# Patient Record
Sex: Male | Born: 1955 | Race: White | Hispanic: No | Marital: Single | Smoking: Current some day smoker
Health system: Southern US, Community
[De-identification: ages and names within clinical notes are randomized; demographics above are authoritative.]

## PROBLEM LIST (undated history)

## (undated) DIAGNOSIS — M109 Gout, unspecified: Secondary | ICD-10-CM

## (undated) DIAGNOSIS — M1990 Unspecified osteoarthritis, unspecified site: Secondary | ICD-10-CM

## (undated) DIAGNOSIS — K219 Gastro-esophageal reflux disease without esophagitis: Secondary | ICD-10-CM

## (undated) HISTORY — PX: CYST EXCISION: SHX5701

## (undated) HISTORY — PX: OTHER SURGICAL HISTORY: SHX169

---

## 2016-10-31 ENCOUNTER — Ambulatory Visit: Payer: Self-pay | Admitting: Surgery

## 2016-10-31 NOTE — H&P (Signed)
Peter Wright 10/31/2016 11:12 AM Location: [facility] Patient #: [id number] DOB: [birth year] Single / Language: [place] / Race: Refused to Report/Unreported Male  History of Present Illness (Ravinder Hofland A. Fredricka Bonine MD; 10/31/2016 11:34 AM) Patient words: This is a very nice 61 year old gentleman who presents with bilateral inguinal hernias. He first noticed them about a year ago. They're reducible, and he does not believe they've increased in size, however they have started to cause him some discomfort and he is interested in repair at this time. He has been avoiding any heavy lifting and believes he will to continue to do so after surgery for as long as necessary. He is not having any changes in bowel function or urinary function. No issues with nausea, emesis or bloating. He does not regularly go to a doctor, but does not have any medical problems that he knows of. He denies any issues with chest pain or pressure or shortness of breath with exertion. No prior abdominal surgeries. He does smoke, less than 1 pack a day and has cut down significantly over the last year.  The patient is a 61 year old male.   Past Surgical History Hennie Duos, CMA; 10/31/2016 11:13 AM) No pertinent past surgical history  Diagnostic Studies History Hennie Duos, CMA; 10/31/2016 11:13 AM) Colonoscopy never  Allergies (Yasmin Roberson, CMA; 10/31/2016 11:12 AM) No Known Drug Allergies 10/31/2016 Allergies Reconciled  Medication History (Yasmin Roberson, CMA; 10/31/2016 11:12 AM) No Current Medications Medications Reconciled  Social History Hennie Duos, CMA; 10/31/2016 11:13 AM) Alcohol use Occasional alcohol use. Caffeine use Carbonated beverages, Coffee, Tea. No drug use Tobacco use Current some day smoker.  Family History Hennie Duos, CMA; 10/31/2016 11:13 AM) Breast Cancer Mother.  Other Problems (Yasmin Roberson, CMA; 10/31/2016 11:13 AM) Inguinal  Hernia     Review of Systems (Yasmin Roberson CMA; 10/31/2016 11:13 AM) General Not Present- Appetite Loss, Chills, Fatigue, Fever, Night Sweats, Weight Gain and Weight Loss. Skin Not Present- Change in Wart/Mole, Dryness, Hives, Jaundice, New Lesions, Non-Healing Wounds, Rash and Ulcer. HEENT Present- Seasonal Allergies. Not Present- Earache, Hearing Loss, Hoarseness, Nose Bleed, Oral Ulcers, Ringing in the Ears, Sinus Pain, Sore Throat, Visual Disturbances, Wears glasses/contact lenses and Yellow Eyes. Respiratory Not Present- Bloody sputum, Chronic Cough, Difficulty Breathing, Snoring and Wheezing. Breast Not Present- Breast Mass, Breast Pain, Nipple Discharge and Skin Changes. Cardiovascular Not Present- Chest Pain, Difficulty Breathing Lying Down, Leg Cramps, Palpitations, Rapid Heart Rate, Shortness of Breath and Swelling of Extremities. Gastrointestinal Present- Abdominal Pain. Not Present- Bloating, Bloody Stool, Change in Bowel Habits, Chronic diarrhea, Constipation, Difficulty Swallowing, Excessive gas, Gets full quickly at meals, Hemorrhoids, Indigestion, Nausea, Rectal Pain and Vomiting. Male Genitourinary Not Present- Blood in Urine, Change in Urinary Stream, Frequency, Impotence, Nocturia, Painful Urination, Urgency and Urine Leakage. Musculoskeletal Not Present- Back Pain, Joint Pain, Joint Stiffness, Muscle Pain, Muscle Weakness and Swelling of Extremities. Neurological Not Present- Decreased Memory, Fainting, Headaches, Numbness, Seizures, Tingling, Tremor, Trouble walking and Weakness. Psychiatric Not Present- Anxiety, Bipolar, Change in Sleep Pattern, Depression, Fearful and Frequent crying. Endocrine Not Present- Cold Intolerance, Excessive Hunger, Hair Changes, Heat Intolerance, Hot flashes and New Diabetes. Hematology Not Present- Blood Thinners, Easy Bruising, Excessive bleeding, Gland problems, HIV and Persistent Infections.  Vitals (Yasmin Roberson CMA; 10/31/2016 11:12  AM) 10/31/2016 11:12 AM Weight: 162.6 lb Height: 69in Body Surface Area: 1.89 m Body Mass Index: 24.01 kg/m  Temp.: 98.14F  Pulse: 66 (Regular)  BP: 130/88 (Sitting, Left Arm, Standard)  Physical Exam (Elder Davidian A. Fredricka Bonine MD; 10/31/2016 11:36 AM)  General Note: Alert, appears older than stated age  Integumentary Note: No lesions or rashes on limited skin exam  Eye Note: Anicteric, extraocular motion intact  ENMT Note: Moist mucous membranes, normal phonation  Chest and Lung Exam Note: Unlabored respirations, symmetrical air entry  Cardiovascular Note: Regular rate and rhythm, no pedal edema  Abdomen Note: Soft, nontender nondistended. No mass or organomegaly. No umbilical hernia. There are bilateral inguinal hernias which are reducible.  Neurologic Note: Grossly normal, normal gait  Neuropsychiatric Note: Normal mood and affect, appropriate insight  Musculoskeletal Note: Strength symmetrical throughout, no deformity    Assessment & Plan (Dorrie Cocuzza A. Fredricka Bonine MD; 10/31/2016 11:38 AM)  BILATERAL INGUINAL HERNIA (K40.20) Story: Bilateral inguinal hernias, reducible. Causing him pain. I recommended a laparoscopic bilateral inguinal hernia repair. I discussed with him the nature of this operation and the use of mesh. I counseled him as to the risks of bleeding, infection, pain, scarring, injury to intra-abdominal or pelvic organs including bowel, nerves, blood vessels and bladder, although very low risk of any of these. We discussed the risk of chronic pain. I counseled him as to the risk of hernia recurrence, advising him that his risk is increased due to his tobacco abuse. He expressed understanding of our conversation and desires to proceed. All his questions were answered.

## 2016-11-12 ENCOUNTER — Ambulatory Visit: Payer: Self-pay | Admitting: General Surgery

## 2016-11-14 NOTE — Pre-Procedure Instructions (Signed)
    Peter Prows Kernan Jr.  11/14/2016      [id number] - [place], [place] - [address] [place] [address] Phone: [phone] Fax: [phone]    Your procedure is scheduled on Monday, April 30th   Report to [facility] Admitting at 1:00 pm             Call this number if you have problems the morning of surgery:  [phone], other questions you may call [phone] from Mon - Fri from 8-4:30pm   Remember:  Do not eat food or drink liquids after midnight Sunday.   Take these medicines the morning of surgery with A SIP OF WATER : NONE                      4-5 days prior to surgery STOP taking any herbal supplements, vitamins, and anti-inflammatories.   Do not wear jewelry - no rings or watches.  Do not wear lotions, colognes or deoderant.             Men may shave face and neck.   Do not bring valuables to the hospital.  [facility] is not responsible for any belongings or valuables.  Contacts, dentures or bridgework may not be worn into surgery.  Leave your suitcase in the car.  After surgery it may be brought to your room. For patients admitted to the hospital, discharge time will be determined by your treatment team.  Patients discharged the day of surgery will not be allowed to drive home, AND you will need to have someone stay with you for 24 hrs.   Please read over the following fact sheets that you were given. Pain Booklet and Surgical Site Infection Prevention

## 2016-11-15 ENCOUNTER — Encounter (HOSPITAL_COMMUNITY): Payer: Self-pay

## 2016-11-15 ENCOUNTER — Encounter (HOSPITAL_COMMUNITY)
Admission: RE | Admit: 2016-11-15 | Discharge: 2016-11-15 | Disposition: A | Discharge status: Home / Self Care | Payer: Worker's Compensation | Source: Ambulatory Visit | Attending: Surgery | Admitting: Surgery

## 2016-11-15 ENCOUNTER — Ambulatory Visit (HOSPITAL_COMMUNITY)
Admission: RE | Admit: 2016-11-15 | Discharge: 2016-11-15 | Disposition: A | Discharge status: Home / Self Care | Payer: Self-pay | Source: Ambulatory Visit | Attending: Surgery | Admitting: Surgery

## 2016-11-15 DIAGNOSIS — K4020 Bilateral inguinal hernia, without obstruction or gangrene, not specified as recurrent: Secondary | ICD-10-CM | POA: Diagnosis not present

## 2016-11-15 DIAGNOSIS — Z01810 Encounter for preprocedural cardiovascular examination: Secondary | ICD-10-CM | POA: Insufficient documentation

## 2016-11-15 DIAGNOSIS — K219 Gastro-esophageal reflux disease without esophagitis: Secondary | ICD-10-CM | POA: Diagnosis not present

## 2016-11-15 DIAGNOSIS — F17200 Nicotine dependence, unspecified, uncomplicated: Secondary | ICD-10-CM | POA: Diagnosis not present

## 2016-11-15 DIAGNOSIS — M1990 Unspecified osteoarthritis, unspecified site: Secondary | ICD-10-CM | POA: Diagnosis not present

## 2016-11-15 DIAGNOSIS — Z803 Family history of malignant neoplasm of breast: Secondary | ICD-10-CM | POA: Diagnosis not present

## 2016-11-15 DIAGNOSIS — J449 Chronic obstructive pulmonary disease, unspecified: Secondary | ICD-10-CM | POA: Diagnosis not present

## 2016-11-15 HISTORY — DX: Gout, unspecified: M10.9

## 2016-11-15 HISTORY — DX: Unspecified osteoarthritis, unspecified site: M19.90

## 2016-11-15 HISTORY — DX: Gastro-esophageal reflux disease without esophagitis: K21.9

## 2016-11-15 LAB — CBC WITH DIFFERENTIAL/PLATELET
Basophils Absolute: 0 10*3/uL (ref 0.0–0.1)
Basophils Relative: 0 %
Eosinophils Absolute: 0.2 10*3/uL (ref 0.0–0.7)
Eosinophils Relative: 2 %
HEMATOCRIT: 43.1 % (ref 39.0–52.0)
HEMOGLOBIN: 14.3 g/dL (ref 13.0–17.0)
LYMPHS ABS: 3.2 10*3/uL (ref 0.7–4.0)
LYMPHS PCT: 30 %
MCH: 27.6 pg (ref 26.0–34.0)
MCHC: 33.2 g/dL (ref 30.0–36.0)
MCV: 83 fL (ref 78.0–100.0)
MONOS PCT: 5 %
Monocytes Absolute: 0.5 10*3/uL (ref 0.1–1.0)
NEUTROS ABS: 6.6 10*3/uL (ref 1.7–7.7)
NEUTROS PCT: 63 %
Platelets: 303 10*3/uL (ref 150–400)
RBC: 5.19 MIL/uL (ref 4.22–5.81)
RDW: 14.7 % (ref 11.5–15.5)
WBC: 10.5 10*3/uL (ref 4.0–10.5)

## 2016-11-15 LAB — BASIC METABOLIC PANEL
ANION GAP: 9 (ref 5–15)
BUN: 8 mg/dL (ref 6–20)
CHLORIDE: 103 mmol/L (ref 101–111)
CO2: 24 mmol/L (ref 22–32)
Calcium: 9.2 mg/dL (ref 8.9–10.3)
Creatinine, Ser: 1.1 mg/dL (ref 0.61–1.24)
GFR calc non Af Amer: >60 mL/min (ref >60)
Glucose, Bld: 93 mg/dL (ref 65–99)
POTASSIUM: 4.2 mmol/L (ref 3.5–5.1)
Sodium: 136 mmol/L (ref 135–145)

## 2016-11-15 LAB — APTT: aPTT: 34 seconds (ref 24–36)

## 2016-11-15 LAB — PROTIME-INR
INR: 1.01
Prothrombin Time: 13.4 seconds (ref 11.4–15.2)

## 2016-11-15 NOTE — Progress Notes (Signed)
Has no PCP.  Goes to Urgent Care off of [address].   Has h/o gout, multiple teeth pull, ? Allergy to bees. Denies any cardiac issues, no cp, or sob

## 2016-11-18 ENCOUNTER — Ambulatory Visit (HOSPITAL_COMMUNITY): Payer: Worker's Compensation | Admitting: Anesthesiology

## 2016-11-18 ENCOUNTER — Ambulatory Visit (HOSPITAL_COMMUNITY)
Admission: RE | Admit: 2016-11-18 | Discharge: 2016-11-18 | Disposition: A | Discharge status: Home / Self Care | Payer: Worker's Compensation | Source: Ambulatory Visit | Attending: Surgery | Admitting: Surgery

## 2016-11-18 ENCOUNTER — Encounter (HOSPITAL_COMMUNITY)
Admission: RE | Disposition: A | Discharge status: Home / Self Care | Payer: Self-pay | Source: Ambulatory Visit | Attending: Surgery

## 2016-11-18 ENCOUNTER — Encounter (HOSPITAL_COMMUNITY): Payer: Self-pay | Admitting: *Deleted

## 2016-11-18 DIAGNOSIS — Z803 Family history of malignant neoplasm of breast: Secondary | ICD-10-CM | POA: Insufficient documentation

## 2016-11-18 DIAGNOSIS — F17200 Nicotine dependence, unspecified, uncomplicated: Secondary | ICD-10-CM | POA: Diagnosis not present

## 2016-11-18 DIAGNOSIS — K219 Gastro-esophageal reflux disease without esophagitis: Secondary | ICD-10-CM | POA: Insufficient documentation

## 2016-11-18 DIAGNOSIS — M1990 Unspecified osteoarthritis, unspecified site: Secondary | ICD-10-CM | POA: Insufficient documentation

## 2016-11-18 DIAGNOSIS — J449 Chronic obstructive pulmonary disease, unspecified: Secondary | ICD-10-CM | POA: Insufficient documentation

## 2016-11-18 DIAGNOSIS — K4020 Bilateral inguinal hernia, without obstruction or gangrene, not specified as recurrent: Secondary | ICD-10-CM | POA: Diagnosis not present

## 2016-11-18 HISTORY — PX: INGUINAL HERNIA REPAIR: SHX194

## 2016-11-18 SURGERY — REPAIR, HERNIA, INGUINAL, BILATERAL, LAPAROSCOPIC
Anesthesia: General | Site: Abdomen | Laterality: Bilateral

## 2016-11-18 MED ORDER — FENTANYL CITRATE (PF) 100 MCG/2ML IJ SOLN
INTRAMUSCULAR | Status: AC
  Filled 2016-11-18: qty 2

## 2016-11-18 MED ORDER — ROCURONIUM BROMIDE 50 MG/5ML IV SOSY
PREFILLED_SYRINGE | INTRAVENOUS | Status: DC | PRN
  Administered 2016-11-18: 40 mg via INTRAVENOUS

## 2016-11-18 MED ORDER — ONDANSETRON HCL 4 MG/2ML IJ SOLN
INTRAMUSCULAR | Status: AC
  Filled 2016-11-18: qty 2

## 2016-11-18 MED ORDER — ACETAMINOPHEN 500 MG PO TABS
1000.0000 mg | ORAL_TABLET | ORAL | Status: AC
  Administered 2016-11-18: 1000 mg via ORAL
  Filled 2016-11-18: qty 2

## 2016-11-18 MED ORDER — HYDROMORPHONE HCL 1 MG/ML IJ SOLN: 0.2500 mg | INTRAMUSCULAR | Status: DC | PRN

## 2016-11-18 MED ORDER — PROPOFOL 10 MG/ML IV BOLUS
INTRAVENOUS | Status: DC | PRN
  Administered 2016-11-18: 150 mg via INTRAVENOUS

## 2016-11-18 MED ORDER — SUCCINYLCHOLINE CHLORIDE 200 MG/10ML IV SOSY
PREFILLED_SYRINGE | INTRAVENOUS | Status: AC
  Filled 2016-11-18: qty 10

## 2016-11-18 MED ORDER — ROCURONIUM BROMIDE 50 MG/5ML IV SOSY
PREFILLED_SYRINGE | INTRAVENOUS | Status: AC
  Filled 2016-11-18: qty 5

## 2016-11-18 MED ORDER — ONDANSETRON HCL 4 MG/2ML IJ SOLN
INTRAMUSCULAR | Status: DC | PRN
  Administered 2016-11-18: 4 mg via INTRAVENOUS

## 2016-11-18 MED ORDER — CEFAZOLIN SODIUM-DEXTROSE 2-4 GM/100ML-% IV SOLN
2.0000 g | INTRAVENOUS | Status: AC
  Administered 2016-11-18: 2 g via INTRAVENOUS
  Filled 2016-11-18: qty 100

## 2016-11-18 MED ORDER — MIDAZOLAM HCL 2 MG/2ML IJ SOLN: 0.5000 mg | Freq: Once | INTRAMUSCULAR | Status: DC | PRN

## 2016-11-18 MED ORDER — BUPIVACAINE HCL (PF) 0.25 % IJ SOLN
INTRAMUSCULAR | Status: AC
  Filled 2016-11-18: qty 30

## 2016-11-18 MED ORDER — PROPOFOL 10 MG/ML IV BOLUS
INTRAVENOUS | Status: AC
  Filled 2016-11-18: qty 20

## 2016-11-18 MED ORDER — CHLORHEXIDINE GLUCONATE 4 % EX LIQD: 60.0000 mL | Freq: Once | CUTANEOUS | Status: DC

## 2016-11-18 MED ORDER — SUGAMMADEX SODIUM 200 MG/2ML IV SOLN
INTRAVENOUS | Status: DC | PRN
  Administered 2016-11-18: 200 mg via INTRAVENOUS

## 2016-11-18 MED ORDER — DOCUSATE SODIUM 100 MG PO CAPS: 100.0000 mg | ORAL_CAPSULE | Freq: Two times a day (BID) | ORAL | 0 refills | Status: AC

## 2016-11-18 MED ORDER — SUGAMMADEX SODIUM 200 MG/2ML IV SOLN
INTRAVENOUS | Status: AC
  Filled 2016-11-18: qty 2

## 2016-11-18 MED ORDER — BUPIVACAINE HCL (PF) 0.25 % IJ SOLN
INTRAMUSCULAR | Status: DC | PRN
  Administered 2016-11-18: 30 mL

## 2016-11-18 MED ORDER — OXYCODONE HCL 5 MG PO TABS: 5.0000 mg | ORAL_TABLET | ORAL | 0 refills | Status: DC | PRN

## 2016-11-18 MED ORDER — EPHEDRINE 5 MG/ML INJ
INTRAVENOUS | Status: AC
  Filled 2016-11-18: qty 10

## 2016-11-18 MED ORDER — EPHEDRINE SULFATE 50 MG/ML IJ SOLN
INTRAMUSCULAR | Status: DC | PRN
  Administered 2016-11-18: 10 mg via INTRAVENOUS

## 2016-11-18 MED ORDER — LABETALOL HCL 5 MG/ML IV SOLN
INTRAVENOUS | Status: AC
  Filled 2016-11-18: qty 4

## 2016-11-18 MED ORDER — MEPERIDINE HCL 50 MG/ML IJ SOLN: 6.2500 mg | INTRAMUSCULAR | Status: DC | PRN

## 2016-11-18 MED ORDER — 0.9 % SODIUM CHLORIDE (POUR BTL) OPTIME
TOPICAL | Status: DC | PRN
  Administered 2016-11-18: 1000 mL

## 2016-11-18 MED ORDER — CELECOXIB 200 MG PO CAPS
400.0000 mg | ORAL_CAPSULE | ORAL | Status: AC
  Administered 2016-11-18: 400 mg via ORAL
  Filled 2016-11-18: qty 2

## 2016-11-18 MED ORDER — LIDOCAINE 2% (20 MG/ML) 5 ML SYRINGE
INTRAMUSCULAR | Status: DC | PRN
  Administered 2016-11-18: 80 mg via INTRAVENOUS

## 2016-11-18 MED ORDER — GABAPENTIN 300 MG PO CAPS
300.0000 mg | ORAL_CAPSULE | ORAL | Status: AC
  Administered 2016-11-18: 300 mg via ORAL
  Filled 2016-11-18: qty 1

## 2016-11-18 MED ORDER — BUPIVACAINE LIPOSOME 1.3 % IJ SUSP
20.0000 mL | Freq: Once | INTRAMUSCULAR | Status: AC
  Administered 2016-11-18: 20 mL
  Filled 2016-11-18: qty 20

## 2016-11-18 MED ORDER — DEXAMETHASONE SODIUM PHOSPHATE 10 MG/ML IJ SOLN
INTRAMUSCULAR | Status: AC
  Filled 2016-11-18: qty 1

## 2016-11-18 MED ORDER — LIDOCAINE 2% (20 MG/ML) 5 ML SYRINGE
INTRAMUSCULAR | Status: AC
  Filled 2016-11-18: qty 5

## 2016-11-18 MED ORDER — FENTANYL CITRATE (PF) 100 MCG/2ML IJ SOLN
INTRAMUSCULAR | Status: DC | PRN
  Administered 2016-11-18 (×4): 50 ug via INTRAVENOUS

## 2016-11-18 MED ORDER — LACTATED RINGERS IV SOLN
INTRAVENOUS | Status: DC
  Administered 2016-11-18: 12:00:00 via INTRAVENOUS

## 2016-11-18 MED ORDER — SUCCINYLCHOLINE CHLORIDE 200 MG/10ML IV SOSY
PREFILLED_SYRINGE | INTRAVENOUS | Status: DC | PRN
  Administered 2016-11-18: 100 mg via INTRAVENOUS

## 2016-11-18 MED ORDER — PROMETHAZINE HCL 25 MG/ML IJ SOLN: 6.2500 mg | INTRAMUSCULAR | Status: DC | PRN

## 2016-11-18 MED ORDER — DEXAMETHASONE SODIUM PHOSPHATE 10 MG/ML IJ SOLN
INTRAMUSCULAR | Status: DC | PRN
  Administered 2016-11-18: 10 mg via INTRAVENOUS

## 2016-11-18 SURGICAL SUPPLY — 27 items
ADH SKN CLS APL DERMABOND .7 (GAUZE/BANDAGES/DRESSINGS) ×1
CABLE HIGH FREQUENCY MONO STRZ (ELECTRODE) ×3 IMPLANT
CHLORAPREP W/TINT 26ML (MISCELLANEOUS) ×3 IMPLANT
COVER SURGICAL LIGHT HANDLE (MISCELLANEOUS) ×3 IMPLANT
DERMABOND ADVANCED (GAUZE/BANDAGES/DRESSINGS) ×2
DERMABOND ADVANCED .7 DNX12 (GAUZE/BANDAGES/DRESSINGS) ×1 IMPLANT
DEVICE PMI PUNCTURE CLOSURE (MISCELLANEOUS) ×3 IMPLANT
DEVICE SECURE STRAP 25 ABSORB (INSTRUMENTS) ×3 IMPLANT
ELECT REM PT RETURN 15FT ADLT (MISCELLANEOUS) ×3 IMPLANT
GLOVE BIO SURGEON STRL SZ 6 (GLOVE) ×3 IMPLANT
GLOVE INDICATOR 6.5 STRL GRN (GLOVE) ×3 IMPLANT
GOWN STRL REUS W/TWL LRG LVL3 (GOWN DISPOSABLE) ×3 IMPLANT
GOWN STRL REUS W/TWL XL LVL3 (GOWN DISPOSABLE) ×6 IMPLANT
IRRIG SUCT STRYKERFLOW 2 WTIP (MISCELLANEOUS)
IRRIGATION SUCT STRKRFLW 2 WTP (MISCELLANEOUS) IMPLANT
KIT BASIN OR (CUSTOM PROCEDURE TRAY) ×3 IMPLANT
MESH ULTRAPRO 3X6 7.6X15CM (Mesh General) ×6 IMPLANT
NEEDLE INSUFFLATION 14GA 120MM (NEEDLE) ×3 IMPLANT
SCISSORS LAP 5X35 DISP (ENDOMECHANICALS) ×3 IMPLANT
SLEEVE XCEL OPT CAN 5 100 (ENDOMECHANICALS) ×3 IMPLANT
SUT MNCRL AB 4-0 PS2 18 (SUTURE) ×3 IMPLANT
TOWEL OR 17X26 10 PK STRL BLUE (TOWEL DISPOSABLE) ×3 IMPLANT
TOWEL OR NON WOVEN STRL DISP B (DISPOSABLE) IMPLANT
TRAY LAPAROSCOPIC (CUSTOM PROCEDURE TRAY) ×3 IMPLANT
TROCAR BLADELESS OPT 12M 100M (ENDOMECHANICALS) ×3 IMPLANT
TROCAR BLADELESS OPT 5 100 (ENDOMECHANICALS) ×3 IMPLANT
TUBING INSUF HEATED (TUBING) ×3 IMPLANT

## 2016-11-18 NOTE — Anesthesia Postprocedure Evaluation (Signed)
Anesthesia Post Note  Patient: Peter Wright.  Procedure(s) Performed: Procedure(s) (LRB): LAPAROSCOPIC BILATERAL INGUINAL HERNIA REPAIR (TAP) (Bilateral)  Patient location during evaluation: PACU Anesthesia Type: General Level of consciousness: awake and alert, oriented and patient cooperative Pain management: pain level controlled Vital Signs Assessment: post-procedure vital signs reviewed and stable Respiratory status: spontaneous breathing, nonlabored ventilation and respiratory function stable Cardiovascular status: blood pressure returned to baseline and stable Postop Assessment: no signs of nausea or vomiting Anesthetic complications: no       Last Vitals:  Vitals:   11/18/16 1445 11/18/16 1456  BP: (!) 155/97 (!) 154/95  Pulse: 68 63  Resp: 18 14  Temp: 36.2 C 36.2 C    Last Pain:  Vitals:   11/18/16 1405  TempSrc:   PainSc: Asleep                 Maritza Hosterman,E. Axie Hayne

## 2016-11-18 NOTE — Interval H&P Note (Signed)
History and Physical Interval Note:  11/18/2016 11:39 AM  Peter Wright [name].  has presented today for surgery, with the diagnosis of Bilateral inguinal hernias  The various methods of treatment have been discussed with the patient and family. After consideration of risks, benefits and other options for treatment, the patient has consented to  Procedure(s): LAPAROSCOPIC BILATERAL INGUINAL HERNIA REPAIR (TAP) (Bilateral) as a surgical intervention .  The patient's history has been reviewed, patient examined, no change in status, stable for surgery.  I have reviewed the patient's chart and labs.  Questions were answered to the patient's satisfaction.     [name] Lollie Sails

## 2016-11-18 NOTE — Transfer of Care (Signed)
Immediate Anesthesia Transfer of Care Note  Patient: Peter Wright.  Procedure(s) Performed: Procedure(s): LAPAROSCOPIC BILATERAL INGUINAL HERNIA REPAIR (TAP) (Bilateral)  Patient Location: PACU  Anesthesia Type:General  Level of Consciousness: awake and patient cooperative  Airway & Oxygen Therapy: Patient Spontanous Breathing and Patient connected to face mask oxygen  Post-op Assessment: Report given to RN and Post -op Vital signs reviewed and stable  Post vital signs: Reviewed and stable  Last Vitals:  Vitals:   11/18/16 1122  BP: (!) 161/93  Pulse: 64  Resp: 18  Temp: 36.8 C    Last Pain:  Vitals:   11/18/16 1149  TempSrc:   PainSc: 2       Patients Stated Pain Goal: 6 (11/18/16 1149)  Complications: No apparent anesthesia complications

## 2016-11-18 NOTE — H&P (View-Only) (Signed)
Peter Wright 10/31/2016 11:12 AM Location: [facility] Patient #: [id number] DOB: [birth year] Single / Language: [place] / Race: Refused to Report/Unreported Male  History of Present Illness (Kierstan Auer A. Fredricka Bonine MD; 10/31/2016 11:34 AM) Patient words: This is a very nice 61 year old gentleman who presents with bilateral inguinal hernias. He first noticed them about a year ago. They're reducible, and he does not believe they've increased in size, however they have started to cause him some discomfort and he is interested in repair at this time. He has been avoiding any heavy lifting and believes he will to continue to do so after surgery for as long as necessary. He is not having any changes in bowel function or urinary function. No issues with nausea, emesis or bloating. He does not regularly go to a doctor, but does not have any medical problems that he knows of. He denies any issues with chest pain or pressure or shortness of breath with exertion. No prior abdominal surgeries. He does smoke, less than 1 pack a day and has cut down significantly over the last year.  The patient is a 61 year old male.   Past Surgical History Hennie Duos, CMA; 10/31/2016 11:13 AM) No pertinent past surgical history  Diagnostic Studies History Hennie Duos, CMA; 10/31/2016 11:13 AM) Colonoscopy never  Allergies (Yasmin Roberson, CMA; 10/31/2016 11:12 AM) No Known Drug Allergies 10/31/2016 Allergies Reconciled  Medication History (Yasmin Roberson, CMA; 10/31/2016 11:12 AM) No Current Medications Medications Reconciled  Social History Hennie Duos, CMA; 10/31/2016 11:13 AM) Alcohol use Occasional alcohol use. Caffeine use Carbonated beverages, Coffee, Tea. No drug use Tobacco use Current some day smoker.  Family History Hennie Duos, CMA; 10/31/2016 11:13 AM) Breast Cancer Mother.  Other Problems (Yasmin Roberson, CMA; 10/31/2016 11:13 AM) Inguinal  Hernia     Review of Systems (Yasmin Roberson CMA; 10/31/2016 11:13 AM) General Not Present- Appetite Loss, Chills, Fatigue, Fever, Night Sweats, Weight Gain and Weight Loss. Skin Not Present- Change in Wart/Mole, Dryness, Hives, Jaundice, New Lesions, Non-Healing Wounds, Rash and Ulcer. HEENT Present- Seasonal Allergies. Not Present- Earache, Hearing Loss, Hoarseness, Nose Bleed, Oral Ulcers, Ringing in the Ears, Sinus Pain, Sore Throat, Visual Disturbances, Wears glasses/contact lenses and Yellow Eyes. Respiratory Not Present- Bloody sputum, Chronic Cough, Difficulty Breathing, Snoring and Wheezing. Breast Not Present- Breast Mass, Breast Pain, Nipple Discharge and Skin Changes. Cardiovascular Not Present- Chest Pain, Difficulty Breathing Lying Down, Leg Cramps, Palpitations, Rapid Heart Rate, Shortness of Breath and Swelling of Extremities. Gastrointestinal Present- Abdominal Pain. Not Present- Bloating, Bloody Stool, Change in Bowel Habits, Chronic diarrhea, Constipation, Difficulty Swallowing, Excessive gas, Gets full quickly at meals, Hemorrhoids, Indigestion, Nausea, Rectal Pain and Vomiting. Male Genitourinary Not Present- Blood in Urine, Change in Urinary Stream, Frequency, Impotence, Nocturia, Painful Urination, Urgency and Urine Leakage. Musculoskeletal Not Present- Back Pain, Joint Pain, Joint Stiffness, Muscle Pain, Muscle Weakness and Swelling of Extremities. Neurological Not Present- Decreased Memory, Fainting, Headaches, Numbness, Seizures, Tingling, Tremor, Trouble walking and Weakness. Psychiatric Not Present- Anxiety, Bipolar, Change in Sleep Pattern, Depression, Fearful and Frequent crying. Endocrine Not Present- Cold Intolerance, Excessive Hunger, Hair Changes, Heat Intolerance, Hot flashes and New Diabetes. Hematology Not Present- Blood Thinners, Easy Bruising, Excessive bleeding, Gland problems, HIV and Persistent Infections.  Vitals (Yasmin Roberson CMA; 10/31/2016 11:12  AM) 10/31/2016 11:12 AM Weight: 162.6 lb Height: 69in Body Surface Area: 1.89 m Body Mass Index: 24.01 kg/m  Temp.: 98.37F  Pulse: 66 (Regular)  BP: 130/88 (Sitting, Left Arm, Standard)  Physical Exam (Ellon Marasco A. Fredricka Bonine MD; 10/31/2016 11:36 AM)  General Note: Alert, appears older than stated age  Integumentary Note: No lesions or rashes on limited skin exam  Eye Note: Anicteric, extraocular motion intact  ENMT Note: Moist mucous membranes, normal phonation  Chest and Lung Exam Note: Unlabored respirations, symmetrical air entry  Cardiovascular Note: Regular rate and rhythm, no pedal edema  Abdomen Note: Soft, nontender nondistended. No mass or organomegaly. No umbilical hernia. There are bilateral inguinal hernias which are reducible.  Neurologic Note: Grossly normal, normal gait  Neuropsychiatric Note: Normal mood and affect, appropriate insight  Musculoskeletal Note: Strength symmetrical throughout, no deformity    Assessment & Plan (Maya Arcand A. Fredricka Bonine MD; 10/31/2016 11:38 AM)  BILATERAL INGUINAL HERNIA (K40.20) Story: Bilateral inguinal hernias, reducible. Causing him pain. I recommended a laparoscopic bilateral inguinal hernia repair. I discussed with him the nature of this operation and the use of mesh. I counseled him as to the risks of bleeding, infection, pain, scarring, injury to intra-abdominal or pelvic organs including bowel, nerves, blood vessels and bladder, although very low risk of any of these. We discussed the risk of chronic pain. I counseled him as to the risk of hernia recurrence, advising him that his risk is increased due to his tobacco abuse. He expressed understanding of our conversation and desires to proceed. All his questions were answered.

## 2016-11-18 NOTE — Discharge Instructions (Signed)
HERNIA REPAIR: POST OP INSTRUCTIONS  ######################################################################  EAT Gradually transition to a high fiber diet with a fiber supplement over the next few weeks after discharge.  Start with a pureed / full liquid diet (see below)  WALK Walk an hour a day.  Control your pain to do that.    CONTROL PAIN Control pain so that you can walk, sleep, tolerate sneezing/coughing, go up/down stairs.  HAVE A BOWEL MOVEMENT DAILY Keep your bowels regular to avoid problems.  OK to try a laxative to override constipation.  OK to use an antidairrheal to slow down diarrhea.  Call if not better after 2 tries  CALL IF YOU HAVE PROBLEMS/CONCERNS Call if you are still struggling despite following these instructions. Call if you have concerns not answered by these instructions  ######################################################################    1. DIET: Follow a light bland diet the first 24 hours after arrival home, such as soup, liquids, crackers, etc.  Be sure to include lots of fluids daily.  Avoid fast food or heavy meals as your are more likely to get nauseated.  Eat a low fat the next few days after surgery. 2. Take your usually prescribed home medications unless otherwise directed. 3. PAIN CONTROL: a. Pain is best controlled by a usual combination of three different methods TOGETHER: i. Ice/Heat ii. Over the counter pain medication iii. Prescription pain medication b. Most patients will experience some swelling and bruising around the hernia(s) such as the bellybutton, groins, or old incisions.  Ice packs or heating pads (30-60 minutes up to 6 times a day) will help. Use ice for the first few days to help decrease swelling and bruising, then switch to heat to help relax tight/sore spots and speed recovery.  Some people prefer to use ice alone, heat alone, alternating between ice & heat.  Experiment to what works for you.  Swelling and bruising can take  several weeks to resolve.   c. It is helpful to take an over-the-counter pain medication regularly for the first few weeks.  Choose one of the following that works best for you: i. Naproxen (Aleve, etc)  Two  tabs twice a day ii. Ibuprofen (Advil, etc) Three  tabs four times a day (every meal & bedtime) iii. Acetaminophen (Tylenol, etc) 325-650mg  four times a day (every meal & bedtime) d. A  prescription for pain medication should be given to you upon discharge.  Take your pain medication as prescribed.  i. If you are having problems/concerns with the prescription medicine (does not control pain, nausea, vomiting, rash, itching, etc), please call us [phone] to see if we need to switch you to a different pain medicine that will work better for you and/or control your side effect better. ii. If you need a refill on your pain medication, please contact your pharmacy.  They will contact our office to request authorization. Prescriptions will not be filled after 5 pm or on week-ends. 4. Avoid getting constipated.  Between the surgery and the pain medications, it is common to experience some constipation.  Increasing fluid intake and taking a fiber supplement (such as Metamucil, Citrucel, FiberCon, MiraLax, etc) 1-2 times a day regularly will usually help prevent this problem from occurring.  A mild laxative (prune juice, Milk of Magnesia, MiraLax, etc) should be taken according to package directions if there are no bowel movements after 48 hours.   5. Wash / shower every day.  You may shower over skin glue which is water proof.  No soaking or  swimming for at least 2 weeks.  6. The skin glue will flake off after a couple weeks. No need to use any kind of dressing.    7. ACTIVITIES as tolerated:   a. You may resume regular (light) daily activities beginning the next day--such as daily self-care, walking, climbing stairs--gradually increasing activities as tolerated.  If you can walk 30  minutes without difficulty, it is safe to try more intense activity such as jogging, treadmill, bicycling, low-impact aerobics,etc. b. Save the most intensive and strenuous activity for last such as sit-ups, heavy lifting, contact sports, etc  Refrain from any heavy lifting or straining until about 4 weeks after surgery.   c. DO NOT PUSH THROUGH PAIN.  Let pain be your guide: If it hurts to do something, don't do it.  Pain is your body warning you to avoid that activity for another week until the pain goes down. d. You may drive when you are no longer taking prescription pain medication, you can comfortably wear a seatbelt, and you can safely maneuver your car and apply brakes. e. Bonita Quin may have sexual intercourse when it is comfortable.  8. FOLLOW UP in our office a. Please call [facility] at [phone] to set up an appointment to see your surgeon in the office for a follow-up appointment approximately 2-3 weeks after your surgery. b. Make sure that you call for this appointment the day you arrive home to insure a convenient appointment time. 9.  IF YOU HAVE DISABILITY OR FAMILY LEAVE FORMS, BRING THEM TO THE OFFICE FOR PROCESSING.  DO NOT GIVE THEM TO YOUR DOCTOR.  WHEN TO CALL us [phone]: 1. Poor pain control 2. Reactions / problems with new medications (rash/itching, nausea, etc)  3. Fever over 101.5 F (38.5 C) 4. Inability to urinate 5. Nausea and/or vomiting 6. Worsening swelling or bruising 7. Continued bleeding from incision. 8. Increased pain, redness, or drainage from the incision   The clinic staff is available to answer your questions during regular business hours (8:30am-5pm).  Please dont hesitate to call and ask to speak to one of our nurses for clinical concerns.   If you have a medical emergency, go to the nearest emergency room or call 911.  A surgeon from [facility] is always on call at the hospitals in [facility], [place] [address], [address], [place], [place]  [address] ?  [address] [address], [place], [place]   [address] MAIN: [phone] ? TOLL FREE: [phone] ? FAX: [phone] [email or web address]

## 2016-11-18 NOTE — Anesthesia Procedure Notes (Signed)
Procedure Name: Intubation Date/Time: 11/18/2016 12:41 PM Performed by: Dione Booze Pre-anesthesia Checklist: Emergency Drugs available, Suction available, Patient being monitored and Patient identified Patient Re-evaluated:Patient Re-evaluated prior to inductionOxygen Delivery Method: Circle system utilized Preoxygenation: Pre-oxygenation with 100% oxygen Intubation Type: IV induction Laryngoscope Size: Mac and 4 Grade View: Grade I Tube type: Oral Tube size: 7.5 mm Number of attempts: 1 Airway Equipment and Method: Stylet Placement Confirmation: ETT inserted through vocal cords under direct vision,  breath sounds checked- equal and bilateral and positive ETCO2 Secured at: 22 cm Tube secured with: Tape Dental Injury: Teeth and Oropharynx as per pre-operative assessment

## 2016-11-18 NOTE — Anesthesia Preprocedure Evaluation (Addendum)
Anesthesia Evaluation  Patient identified by MRN, date of birth, ID band Patient awake    Reviewed: Allergy & Precautions, NPO status , Patient's Chart, lab work & pertinent test results  History of Anesthesia Complications Negative for: history of anesthetic complications  Airway Mallampati: I  TM Distance: >3 FB Neck ROM: Full    Dental  (+) Edentulous Upper, Edentulous Lower   Pulmonary COPD, Current Smoker,    breath sounds clear to auscultation       Cardiovascular negative cardio ROS   Rhythm:Regular Rate:Normal     Neuro/Psych negative neurological ROS     GI/Hepatic Neg liver ROS, GERD  Controlled,  Endo/Other  negative endocrine ROS  Renal/GU negative Renal ROS     Musculoskeletal  (+) Arthritis , Osteoarthritis,    Abdominal   Peds  Hematology negative hematology ROS (+)   Anesthesia Other Findings   Reproductive/Obstetrics                            Anesthesia Physical Anesthesia Plan  ASA: II  Anesthesia Plan: General   Post-op Pain Management:    Induction: Intravenous  Airway Management Planned: Oral ETT  Additional Equipment:   Intra-op Plan:   Post-operative Plan: Extubation in OR  Informed Consent: I have reviewed the patients History and Physical, chart, labs and discussed the procedure including the risks, benefits and alternatives for the proposed anesthesia with the patient or authorized representative who has indicated his/her understanding and acceptance.     Plan Discussed with: CRNA and Surgeon  Anesthesia Plan Comments: (Plan routine monitors, GETA)        Anesthesia Quick Evaluation

## 2016-11-18 NOTE — Op Note (Signed)
Operative Note  Ahron Hulbert  [id number]  [id number]  11/18/2016   Surgeon: Berna Bue  Assistant: OR staff  Procedure performed: Laparoscopic bilateral inguinal hernia repairs with mesh (TAPP)  Preop diagnosis: Bilateral inguinal hernia repairs, reducible Post-op diagnosis/intraop findings: Bilateral direct inguinal hernia repairs, reducible  Specimens: None Retained items: None EBL: <10cc Complications: none  Description of procedure: After obtaining informed consent the patient was taken to the operating room and placed supine on operating room table wheregeneral endotracheal anesthesia was initiated, preoperative antibiotics were administered, SCDs applied, and a formal timeout was performed. Patient's abdomen was clipped prepped and draped in usual sterile fashion. An infraumbilical Veress needle was used to access the peritoneal cavity which was then insufflated to 15 mmHg. A 5 mm trocar and angle scope were introduced and the abdomen surveyed. There was no evidence of injury from our entry. Bilateral direct inguinal hernias were present. There were adhesions noted in the right lower quadrant between the cecum and the abdominal wall. A 12 mm left sided trocar and another 5 mm right-sided trocar were introduced under direct visualization and the patient was placed in steep Trendelenburg position. The right side was addressed first. Filmy adhesions between the cecum and the abdominal wall were sharply lysed in order to improve visualization of the pelvis. The peritoneum was scored from just medial to the anterior superior iliac spine towards the medial umbilical ligament and then the peritoneal flap was developed using combination of blunt dissection and electrocautery. The hernia sac reduced easily. The inferior epigastric vessels, the vas and the gonadal vessels were easily visualized and carefully preserved. The peritoneal flap was carried down to create adequate room  for the mesh. The transversalis fascia was reduced from the hernia defect and tacked to the Cooper's ligament. A piece of 3 x 6 ultra Pro mesh was introduced to the field and trimmed to see the defect. This was affixed to the Cooper's ligament as well as superiorly on either side of the inferior epigastric vessels using the secure strap tacker. The mesh was noted to lie nicely flat within the field with several centimeters of overlap circumferentially around the hernia defect. The peritoneal flap was then brought up to cover the mesh and re-opposed to the anterior abdominal wall using the secure strap tacker. Then turned to the left side and in a similar fashion, peritoneal flap was developed using sharp dissection and electrocautery, extending from just medial to the anterior superior iliac spine towards the medial umbilical ligament. The direct hernia sac was easily reduced and the flap was carried down well below the level of the hernia with appropriate preservation of the inferior epigastric, gonadal vessels and the vas deferens the Cooper's ligament was visualized, and the transversalis fascia was reduced from the hernia defect intact to it to reduce dead space. Again a 3 x 6 piece of ultra Pro mesh was brought to the field and trimmed to see the defect, introduced into the abdominal cavity and seated nicely over the hernia defect. This was tacked to the Cooper's ligament as well as on either side of the inferior epigastric vessels superiorly. The peritoneal flap was then brought up to cover the mesh, ensuring that the mesh continue to lie flat within the space. The peritoneum was reapposed the anterior abdominal wall using serial fires of the secure strap tacker. The abdomen was again surveyed confirming no injuries, and that the mesh was well covered by the peritoneal flaps. Hemostasis was ensured. The 12  mm trocar in the left hemiabdomen was then removed and the fascia here was closed with 0 Vicryl using a  laparoscopic suture passer under direct visualization. Exparel mixed with Marcaine was then infiltrated into the abdominal wall to reform a laparoscopic assisted taps/ ilioinguinal nerve blocks bilaterally. The trochars were removed and the abdomen was desufflated. The remaining local was infiltrated within the soft tissues around each incision. The skin incisions were then closed with running subcuticular Monocryl and Dermabond. The patient was then awakened, extubated and taken to PACU in stable condition.   All counts were correct at the completion of the case.

## 2016-11-19 ENCOUNTER — Encounter (HOSPITAL_COMMUNITY): Payer: Self-pay | Admitting: Surgery

## 2018-05-05 ENCOUNTER — Emergency Department (HOSPITAL_COMMUNITY)
Admission: EM | Admit: 2018-05-05 | Discharge: 2018-05-05 | Disposition: A | Discharge status: Home / Self Care | Payer: Self-pay | Attending: Emergency Medicine | Admitting: Emergency Medicine

## 2018-05-05 ENCOUNTER — Encounter (HOSPITAL_COMMUNITY): Payer: Self-pay

## 2018-05-05 ENCOUNTER — Emergency Department (HOSPITAL_COMMUNITY): Payer: Self-pay

## 2018-05-05 ENCOUNTER — Other Ambulatory Visit: Payer: Self-pay

## 2018-05-05 DIAGNOSIS — F17210 Nicotine dependence, cigarettes, uncomplicated: Secondary | ICD-10-CM | POA: Insufficient documentation

## 2018-05-05 DIAGNOSIS — M7121 Synovial cyst of popliteal space [Baker], right knee: Secondary | ICD-10-CM | POA: Insufficient documentation

## 2018-05-05 DIAGNOSIS — Z79899 Other long term (current) drug therapy: Secondary | ICD-10-CM | POA: Insufficient documentation

## 2018-05-05 DIAGNOSIS — M25461 Effusion, right knee: Secondary | ICD-10-CM | POA: Insufficient documentation

## 2018-05-05 DIAGNOSIS — R600 Localized edema: Secondary | ICD-10-CM | POA: Insufficient documentation

## 2018-05-05 MED ORDER — HYDROCODONE-ACETAMINOPHEN 5-325 MG PO TABS: 1.0000 | ORAL_TABLET | ORAL | 0 refills | Status: DC | PRN

## 2018-05-05 MED ORDER — PREDNISONE 10 MG PO TABS: ORAL_TABLET | ORAL | 0 refills | Status: DC

## 2018-05-05 MED ORDER — PREDNISONE 50 MG PO TABS
60.0000 mg | ORAL_TABLET | Freq: Once | ORAL | Status: AC
  Administered 2018-05-05: 14:00:00 60 mg via ORAL
  Filled 2018-05-05: qty 1

## 2018-05-05 NOTE — ED Notes (Signed)
Patient transported to Ultrasound 

## 2018-05-05 NOTE — ED Notes (Signed)
Patient transported to X-ray 

## 2018-05-05 NOTE — ED Notes (Signed)
Pt verbalized understanding of no driving and to use caution within 4 hours of taking pain meds due to meds cause drowsiness 

## 2018-05-05 NOTE — ED Notes (Signed)
Pt returned from xray.    Dr. Adriana Simas in room to assess pt.

## 2018-05-05 NOTE — ED Provider Notes (Addendum)
[facility] EMERGENCY DEPARTMENT Provider Note   CSN: [id number] Arrival date & time: 05/05/18  1101     History   Chief Complaint Chief Complaint  Patient presents with  . Leg Pain    HPI Peter Wright. is a 62 y.o. male with a history of arthritis, gout and GERD presenting with a 3-day history of painful tender right swollen leg which is worsened with movement and weightbearing.  He has noticed swelling of the entire right leg including his foot ankle, calf through his mid upper thigh and reports pain and fullness in the right knee.  He has increased pain with weightbearing and also with attempts to extend the knee, describing a full/pulling sensation posterior to the knee to the calf muscle.  He denies injuries, overuse, no recent long car rides, plane trips, no risk factors for DVT.  He states occasionally his gout will radiate up into the knees, but states this pain is different from a typical gout flare.  He has had no treatment for this condition prior to arrival.  He denies fevers or chills, nausea, vomiting, rash.  The history is provided by the patient.    Past Medical History:  Diagnosis Date  . Arthritis   . GERD (gastroesophageal reflux disease)    takes otc meds  . Gout    occasionally hits the feet and has 'moved up to my knees'    There are no active problems to display for this patient.   Past Surgical History:  Procedure Laterality Date  . CYST EXCISION     from scrotum     37 yrs ago  . INGUINAL HERNIA REPAIR Bilateral 11/18/2016   Procedure: LAPAROSCOPIC BILATERAL INGUINAL HERNIA REPAIR (TAP);  Surgeon: Berna Bue, MD;  Location: [facility] ORS;  Service: General;  Laterality: Bilateral;  . teeth removal     he thinks 10 yrs ago        Home Medications    Prior to Admission medications   Medication Sig Start Date End Date Taking? Authorizing Provider  acetaminophen (TYLENOL) 325 MG tablet Take 650-975 mg by mouth every 6 (six) hours as needed  (for pain/headache.).    [provider]  HYDROcodone-acetaminophen (NORCO/VICODIN) 5-325 MG tablet Take 1 tablet by mouth every 4 (four) hours as needed. 05/05/18   Burgess Amor, PA-C  ibuprofen (ADVIL,MOTRIN) 200 MG tablet Take 400-600 mg by mouth every 6 (six) hours as needed (for pain/headache.).    [provider]  oxyCODONE (ROXICODONE) 5 MG immediate release tablet Take 1 tablet (5 mg total) by mouth every 4 (four) hours as needed for severe pain. 11/18/16   Berna Bue, MD  predniSONE (DELTASONE) 10 MG tablet Take 6 tablets day one, 5 tablets day two, 4 tablets day three, 3 tablets day four, 2 tablets day five, then 1 tablet day six 05/06/18   Burgess Amor, PA-C    Family History History reviewed. No pertinent family history.  Social History Social History   Tobacco Use  . Smoking status: Current Some Day Smoker    Packs/day: 1.00    Years: 45.00    Pack years: 45.00    Types: Cigarettes  . Smokeless tobacco: Never Used  Substance Use Topics  . Alcohol use: No  . Drug use: No     Allergies   No known allergies   Review of Systems Review of Systems  Constitutional: Negative for chills and fever.  HENT: Negative.   Respiratory: Negative for cough  and shortness of breath.   Musculoskeletal: Positive for arthralgias and joint swelling. Negative for myalgias.  Skin: Negative.  Negative for color change, pallor, rash and wound.  Neurological: Negative for weakness and numbness.     Physical Exam Updated Vital Signs BP (!) 164/86 (BP Location: Right Arm)   Pulse 74   Temp 97.7 F (36.5 C) (Oral)   Resp 15   Ht 5\' 8"  (1.727 m)   Wt 77.1 kg   SpO2 97%   BMI 25.85 kg/m   Physical Exam  Constitutional: He appears well-developed and well-nourished.  HENT:  Head: Normocephalic and atraumatic.  Neck: Normal range of motion.  Cardiovascular: Normal rate.  Pulses:      Dorsalis pedis pulses are 2+ on the right side, and 2+ on the left side.    Pulses equal bilaterally  Pulmonary/Chest: Effort normal and breath sounds normal.  Abdominal: Soft. He exhibits no distension. There is no tenderness.  Musculoskeletal: He exhibits edema and tenderness.  ttp with nonpitting edema mid anterior tibial through upper thigh including the knee joint.  No erythema, no palpable cords, negative Homans.  Neurological: He is alert. He has normal strength. He displays normal reflexes. No sensory deficit.  Skin: Skin is warm and dry.  Psychiatric: He has a normal mood and affect.     ED Treatments / Results  Labs (all labs ordered are listed, but only abnormal results are displayed) Labs Reviewed - No data to display  EKG None  Radiology US Venous Img Lower Right (dvt Study)  Result Date: 05/05/2018 CLINICAL DATA:  Edema in redness x3 days EXAM: RIGHT LOWER EXTREMITY VENOUS DOPPLER ULTRASOUND TECHNIQUE: Gray-scale sonography with compression, as well as color and duplex ultrasound, were performed to evaluate the deep venous system from the level of the common femoral vein through the popliteal and proximal calf veins. COMPARISON:  None FINDINGS: Normal compressibility of the common femoral, superficial femoral, and popliteal veins, as well as the proximal calf veins. No filling defects to suggest DVT on grayscale or color Doppler imaging. Doppler waveforms show normal direction of venous flow, normal respiratory phasicity and response to augmentation. 5.5 x 1.9 cm elongated fluid collection in the posterior popliteal fossa. Survey views of the contralateral common femoral vein are unremarkable. IMPRESSION: 1.  No evidence of right lower extremity deep vein thrombosis. 2. Right Baker's cyst. Electronically Signed   By: Corlis Leak M.D.   On: 05/05/2018 13:11   Dg Knee Complete 4 Views Right  Result Date: 05/05/2018 CLINICAL DATA:  Right knee pain and swelling since 05/03/2018. No known injury. EXAM: RIGHT KNEE - COMPLETE 4+ VIEW COMPARISON:  None.  FINDINGS: No bony abnormality is identified. The patient has a small to moderate knee joint effusion. The subcutaneous soft tissues about the knee appear infiltrating compatible with edema. IMPRESSION: Soft tissues about the knee appear edematous and the patient has a small to moderate joint effusion. No bony abnormality is identified. Electronically Signed   By: Drusilla Kanner M.D.   On: 05/05/2018 14:00    Procedures Procedures (including critical care time)  Medications Ordered in ED Medications  predniSONE (DELTASONE) tablet 60 mg (has no administration in time range)     Initial Impression / Assessment and Plan / ED Course  I have reviewed the triage vital signs and the nursing notes.  Pertinent labs & imaging results that were available during my care of the patient were reviewed by me and considered in my medical decision making (see  chart for details).     Imaging reviewed and discussed with patient.  Patient has a history of gout it is possible that his symptoms are from this condition.  He also may have an orthopedic source of the swelling, but his x-rays are negative for acute bony injuries or arthritic changes.  He has no risk factors and there are no exam findings to suggest septic joint.  He was put on a course of prednisone, hydrocodone also prescribed for pain relief.  The narcotic database was reviewed prior to prescribing this medication.  Discussed heat, elevation, minimizing knee joint movement.  Referral to orthopedics for follow-up care if symptoms persist or do not improve with this treatment.  Final Clinical Impressions(s) / ED Diagnoses   Final diagnoses:  Knee effusion, right  Baker's cyst of knee, right    ED Discharge Orders         Ordered    predniSONE (DELTASONE) 10 MG tablet     05/05/18 1412    HYDROcodone-acetaminophen (NORCO/VICODIN) 5-325 MG tablet  Every 4 hours PRN     05/05/18 1416           Burgess Amor, PA-C 05/05/18 1419    Burgess Amor, PA-C 05/05/18 1419    Donnetta Hutching, MD 05/06/18 1625

## 2018-05-05 NOTE — Discharge Instructions (Signed)
You have a fair amount of fluid collecting your knee joint and behind the knee causing your pain and swelling.  Your xray is negative however for arthritis as the source of this swelling.  This may be from a gouty flare up but also may be due to a soft tissue injury (cartilage or ligament problem in the knee joint) which would not show up on xrays.  Use elevation as much as possible and apply a heating pad to your knee 20 minutes 3-4 times daily.  Take your next dose of prednisone tomorrow.  You may take the hydrocodone prescribed for pain relief.  This will make you drowsy - do not drive within 4 hours of taking this medication.

## 2018-05-05 NOTE — ED Triage Notes (Signed)
Pt states right leg is swollen and painful to walk on that began Sunday, denies injury

## 2018-05-05 NOTE — ED Notes (Signed)
ED Provider at bedside. 

## 2019-03-14 IMAGING — DX DG KNEE COMPLETE 4+V*R*
4 series · 4 of 4 positions shown · non-contrast
Comparison: None.

CLINICAL DATA: Right knee pain and swelling since 05/03/2018. No
known injury.

EXAM:
RIGHT KNEE - COMPLETE 4+ VIEW

[knee ap]
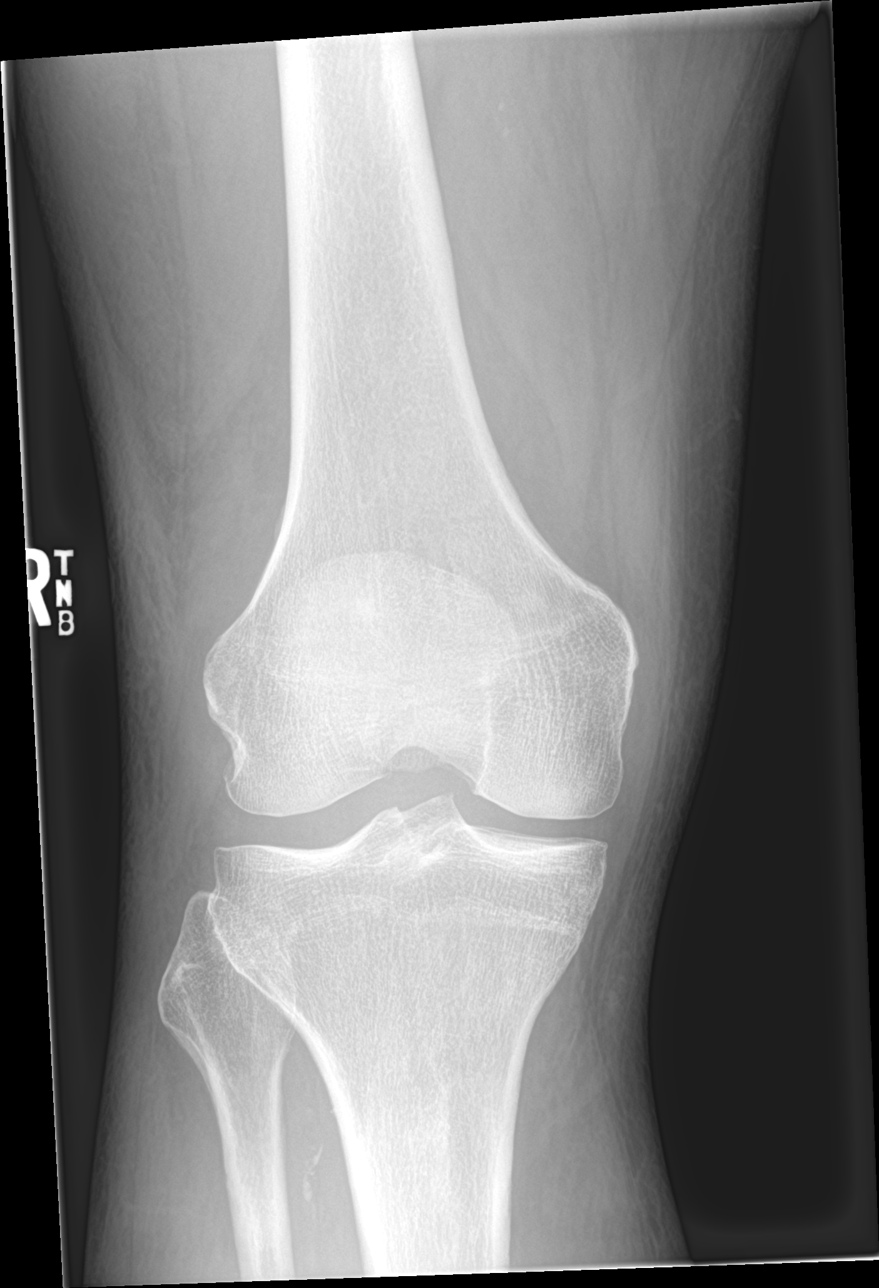

[tunnel]
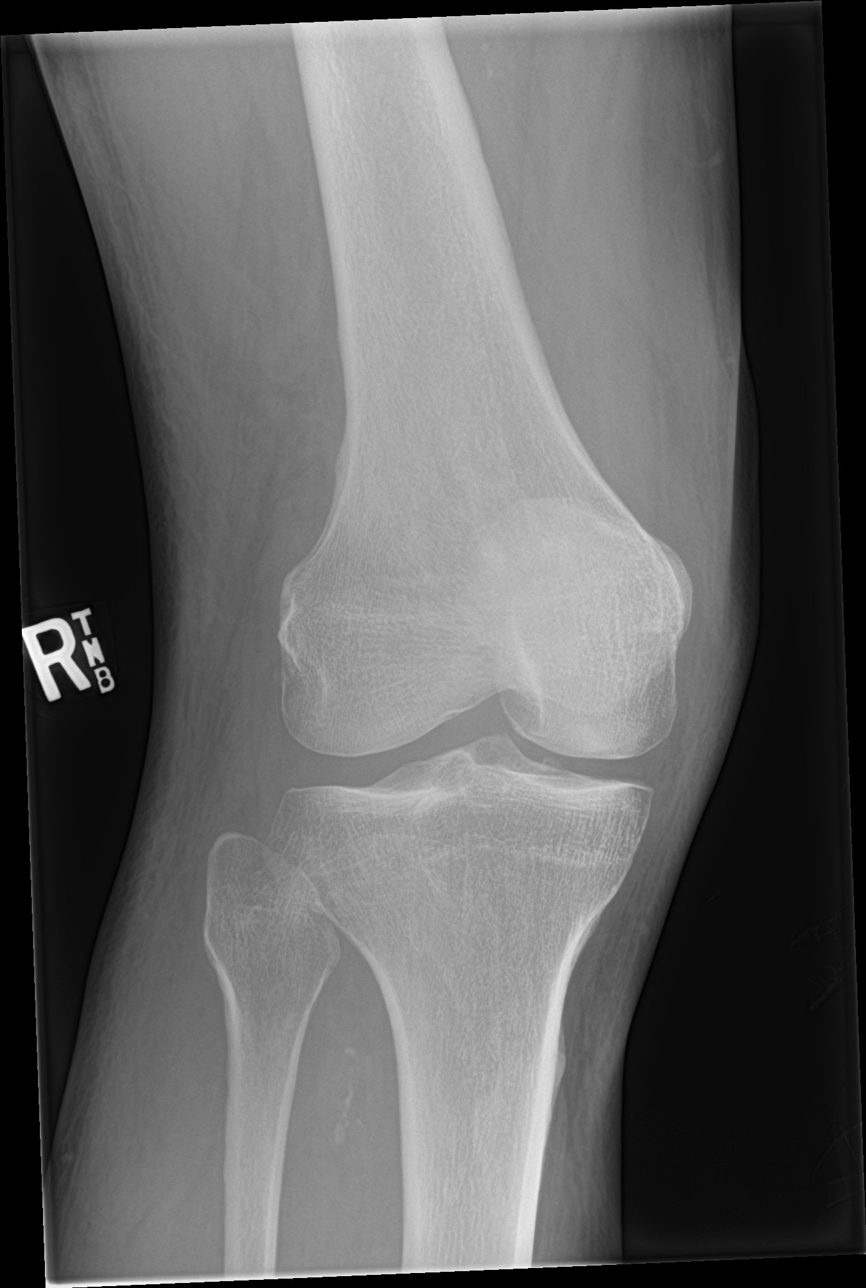

[knee lat]
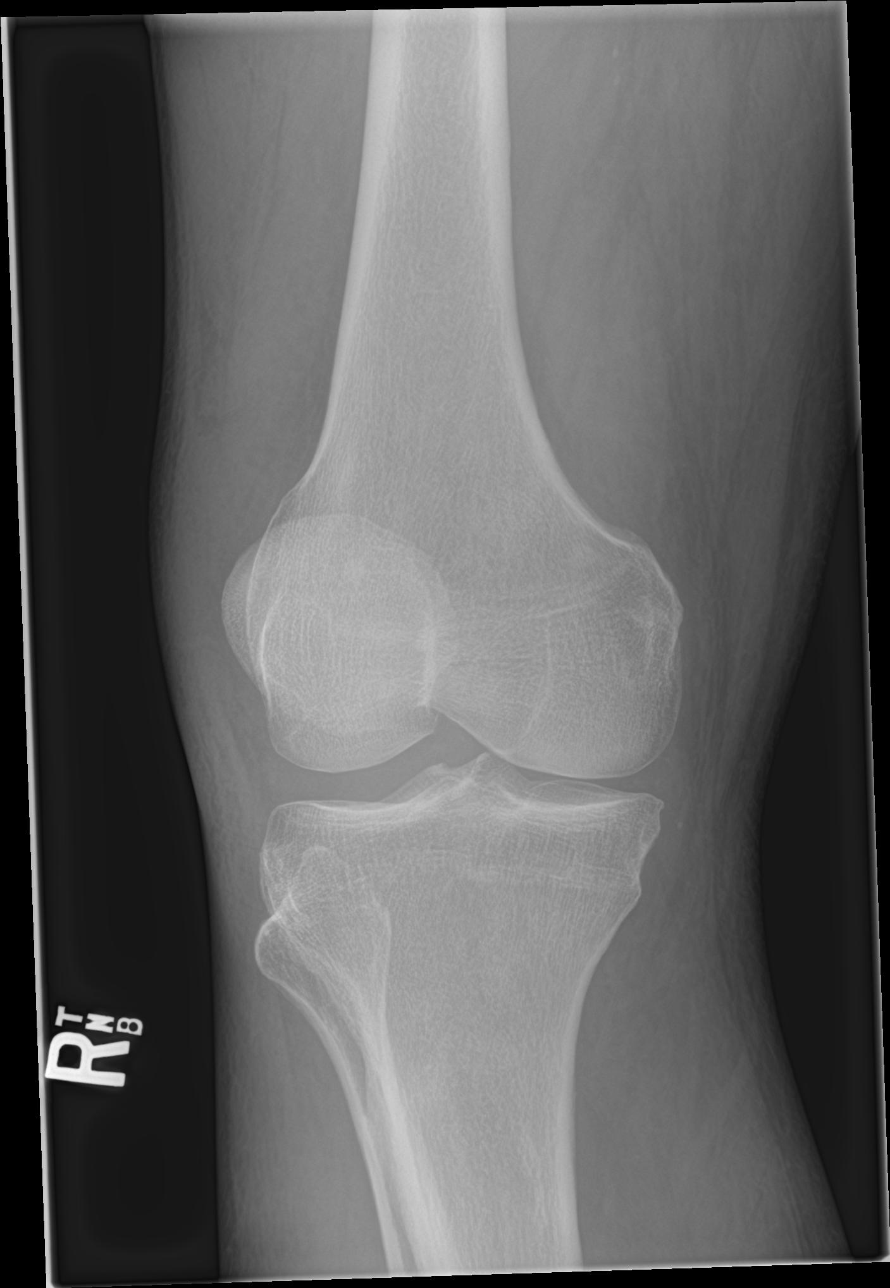

[knee sunrise]
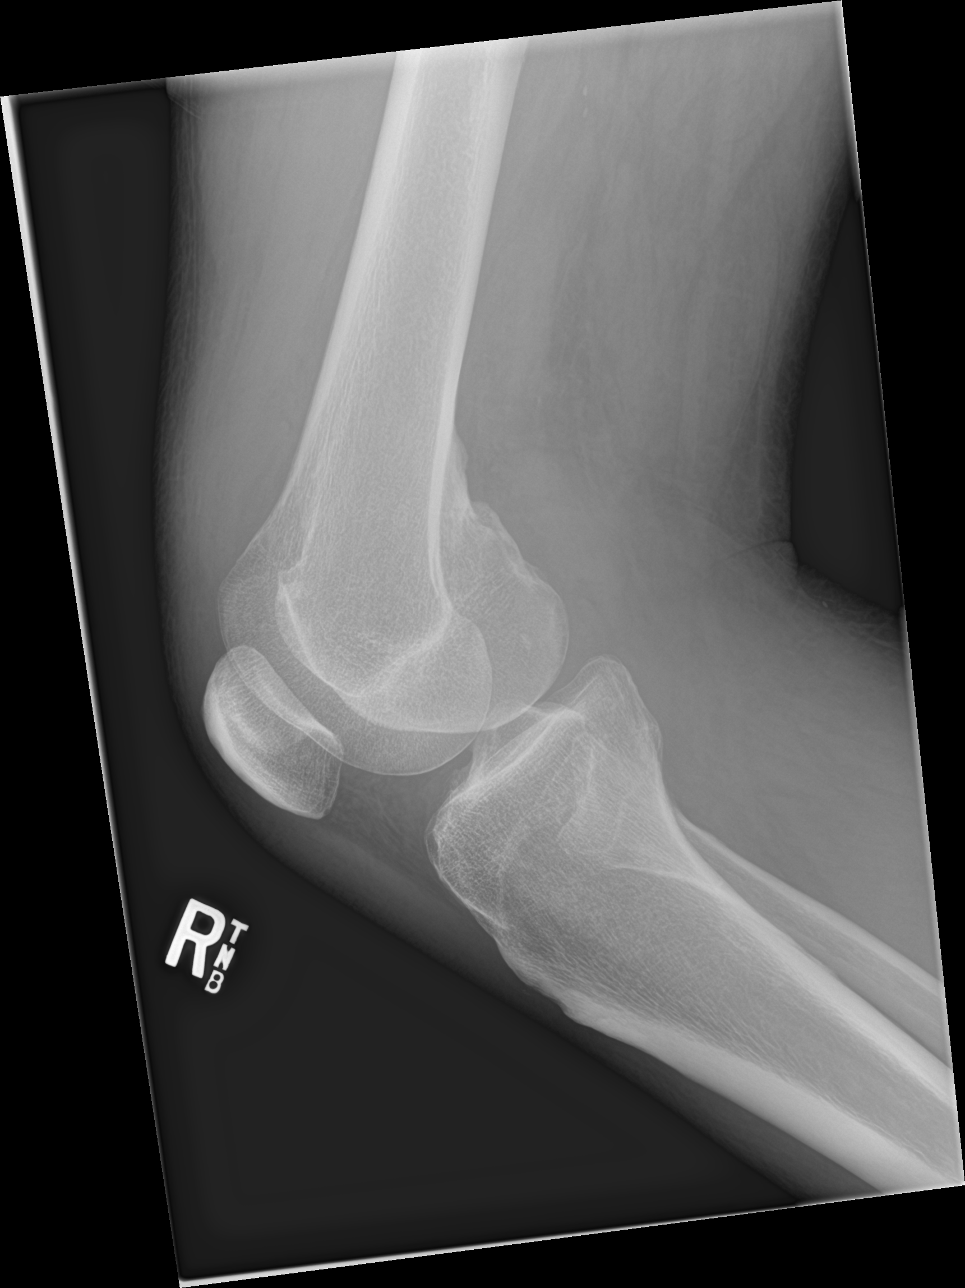

[4 of 4 positions shown; findings below may reference images not displayed]

FINDINGS: No bony abnormality is identified. The patient has a small to
moderate knee joint effusion. The subcutaneous soft tissues about
the knee appear infiltrating compatible with edema.
IMPRESSION: Soft tissues about the knee appear edematous and the patient has a
small to moderate joint effusion. No bony abnormality is identified.

## 2019-06-06 IMAGING — US US EXTREM LOW VENOUS*R*
1 series · 14 of 24 positions shown · non-contrast
Comparison: None

CLINICAL DATA: Edema in redness x3 days

EXAM:
RIGHT LOWER EXTREMITY VENOUS DOPPLER ULTRASOUND
TECHNIQUE: Gray-scale sonography with compression, as well as color and duplex
ultrasound, were performed to evaluate the deep venous system from
the level of the common femoral vein through the popliteal and
proximal calf veins.

[Series 1: us extrem low venous*right* · 0.10mm/px · 14 of 45 slices shown]
[im 1/45]
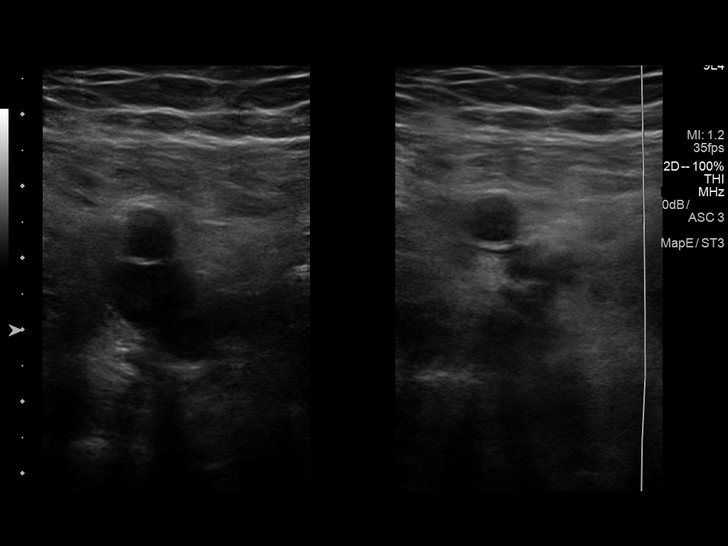
[im 4/45]
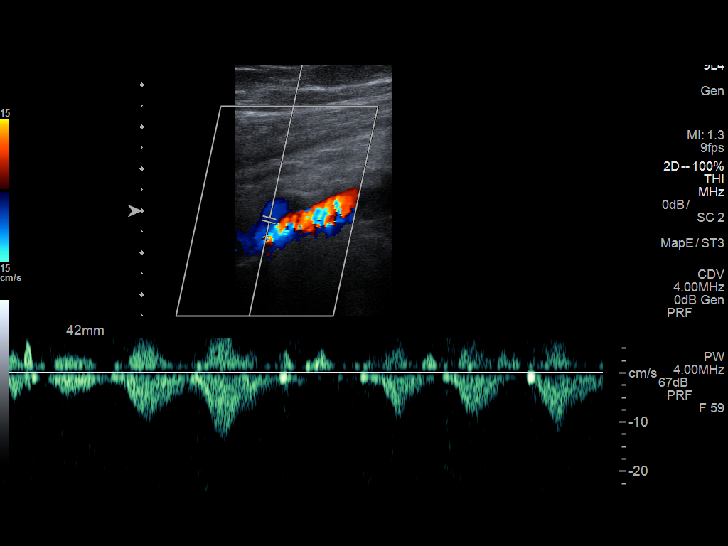
[im 8/45]
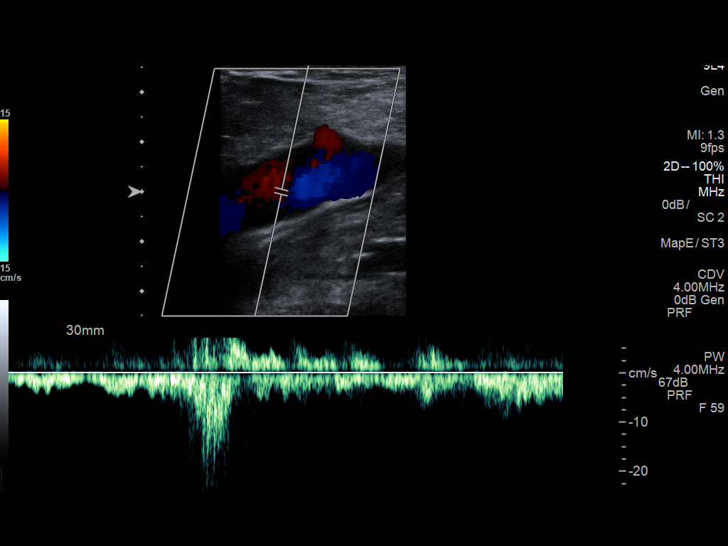
[im 12/45]
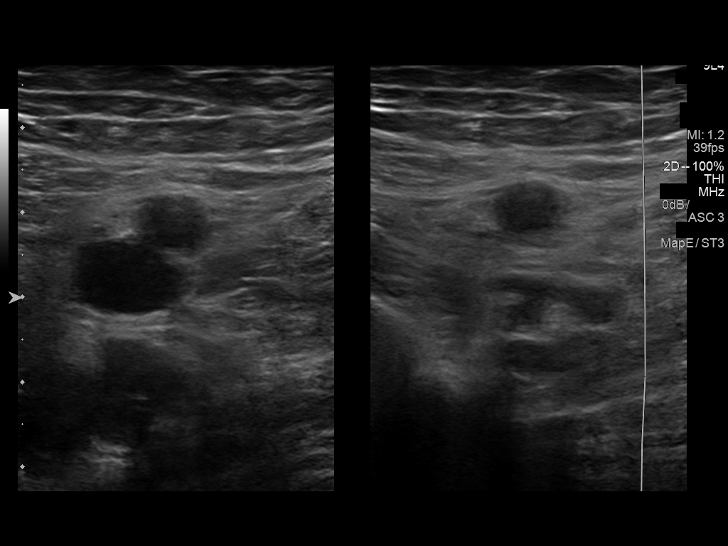
[im 14/45]
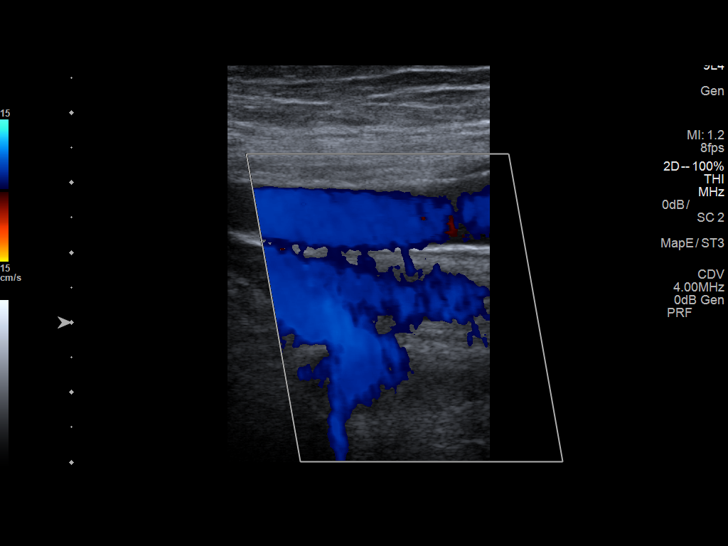
[im 18/45]
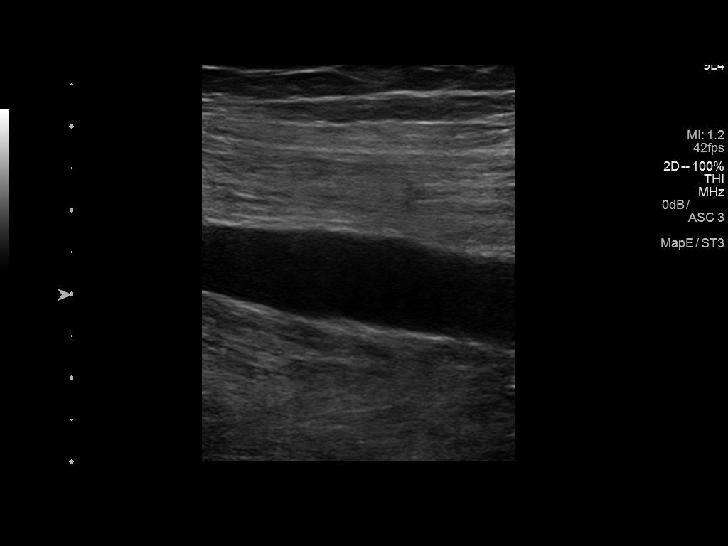
[im 22/45]
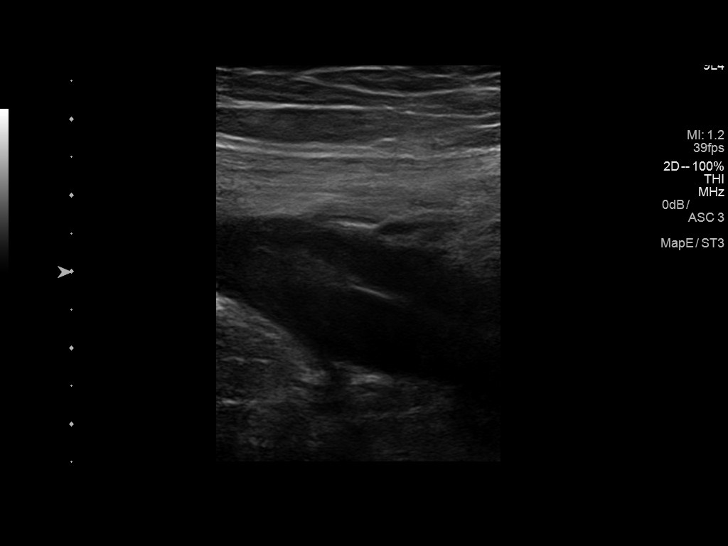
[im 23/45]
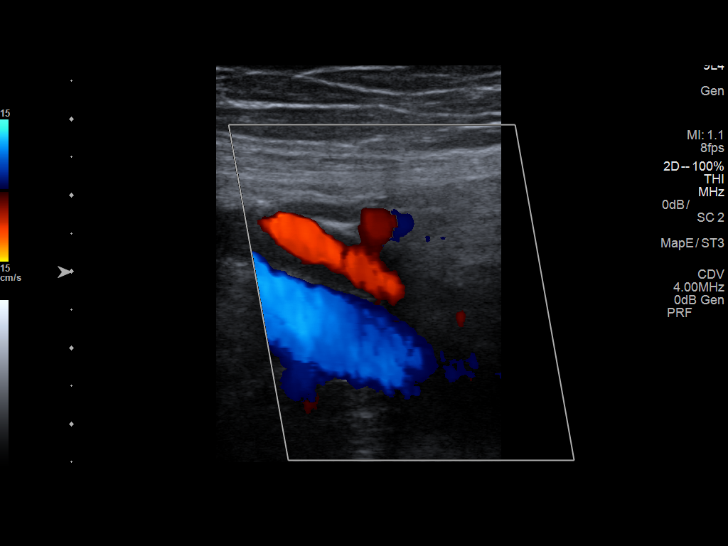
[im 27/45]
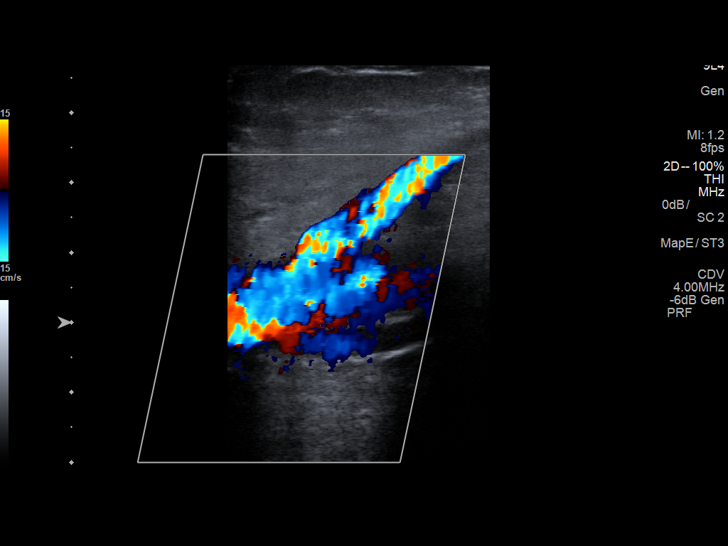
[im 31/45]
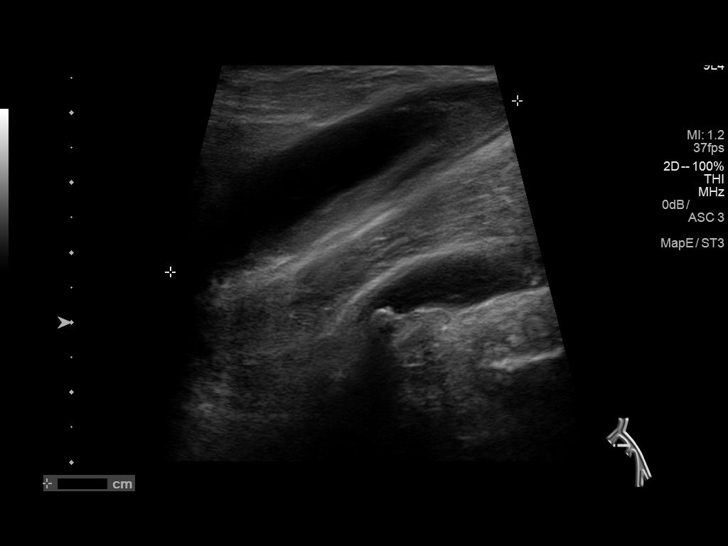
[im 35/45]
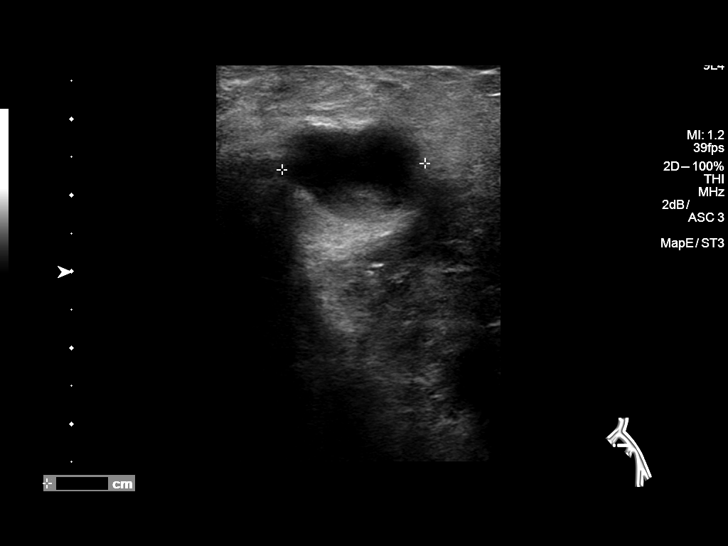
[im 37/45]
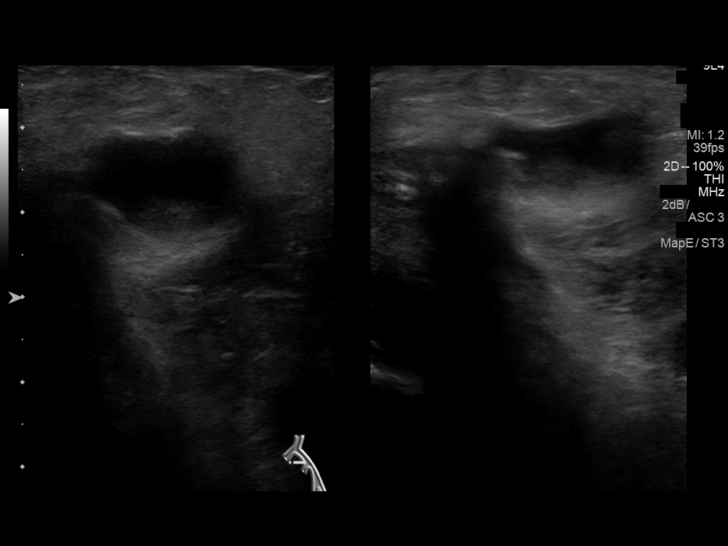
[im 41/45]
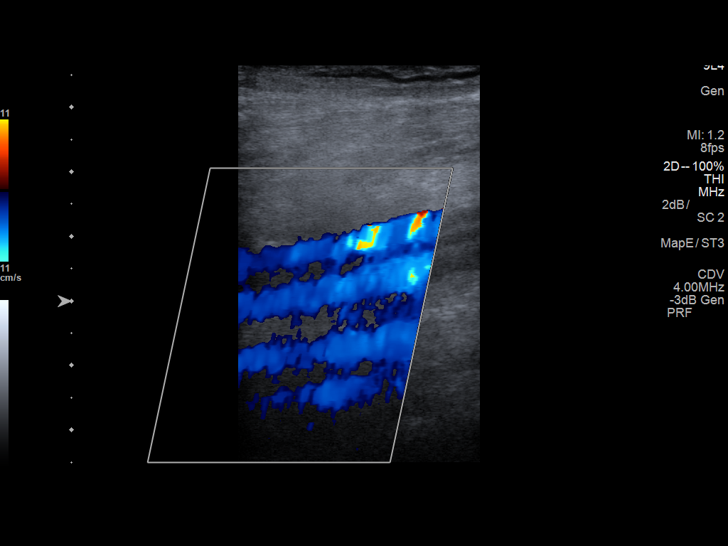
[im 45/45]
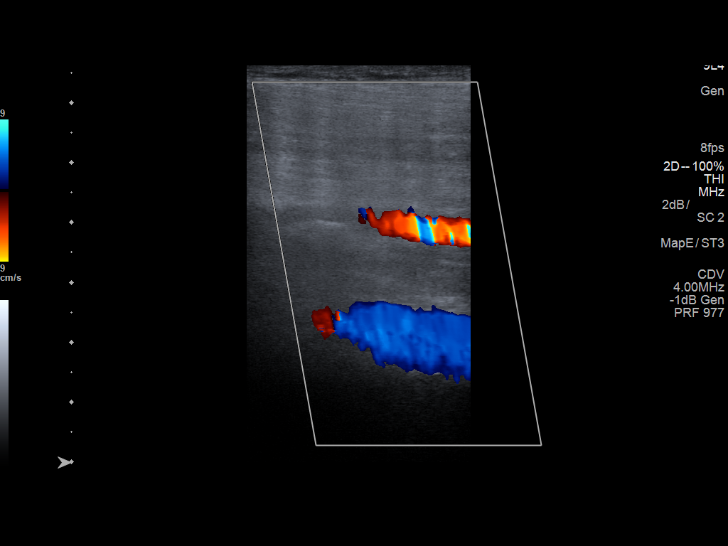

[14 of 24 positions shown; findings below may reference images not displayed]

FINDINGS: Normal compressibility of the common femoral, superficial femoral,
and popliteal veins, as well as the proximal calf veins. No filling
defects to suggest DVT on grayscale or color Doppler imaging.
Doppler waveforms show normal direction of venous flow, normal
respiratory phasicity and response to augmentation.

5.5 x 1.9 cm elongated fluid collection in the posterior popliteal
fossa.

Survey views of the contralateral common femoral vein are
unremarkable.
IMPRESSION: 1.  No evidence of right lower extremity deep vein thrombosis.
2. Right Baker's cyst.

## 2021-08-15 ENCOUNTER — Emergency Department (HOSPITAL_BASED_OUTPATIENT_CLINIC_OR_DEPARTMENT_OTHER): Payer: Self-pay | Admitting: Radiology

## 2021-08-15 ENCOUNTER — Inpatient Hospital Stay (HOSPITAL_BASED_OUTPATIENT_CLINIC_OR_DEPARTMENT_OTHER)
Admission: EM | Admit: 2021-08-15 | Discharge: 2021-08-17 | DRG: 177 | Disposition: A | Discharge status: Home / Self Care | Payer: Self-pay | Attending: Internal Medicine | Admitting: Internal Medicine

## 2021-08-15 ENCOUNTER — Encounter (HOSPITAL_BASED_OUTPATIENT_CLINIC_OR_DEPARTMENT_OTHER): Payer: Self-pay | Admitting: Obstetrics and Gynecology

## 2021-08-15 ENCOUNTER — Other Ambulatory Visit: Payer: Self-pay

## 2021-08-15 DIAGNOSIS — R7989 Other specified abnormal findings of blood chemistry: Secondary | ICD-10-CM | POA: Diagnosis present

## 2021-08-15 DIAGNOSIS — E871 Hypo-osmolality and hyponatremia: Secondary | ICD-10-CM | POA: Diagnosis present

## 2021-08-15 DIAGNOSIS — M1990 Unspecified osteoarthritis, unspecified site: Secondary | ICD-10-CM | POA: Diagnosis present

## 2021-08-15 DIAGNOSIS — J440 Chronic obstructive pulmonary disease with acute lower respiratory infection: Secondary | ICD-10-CM | POA: Diagnosis present

## 2021-08-15 DIAGNOSIS — U071 COVID-19: Principal | ICD-10-CM | POA: Diagnosis present

## 2021-08-15 DIAGNOSIS — N289 Disorder of kidney and ureter, unspecified: Secondary | ICD-10-CM

## 2021-08-15 DIAGNOSIS — D75839 Thrombocytosis, unspecified: Secondary | ICD-10-CM | POA: Diagnosis present

## 2021-08-15 DIAGNOSIS — D638 Anemia in other chronic diseases classified elsewhere: Secondary | ICD-10-CM | POA: Diagnosis present

## 2021-08-15 DIAGNOSIS — R531 Weakness: Secondary | ICD-10-CM

## 2021-08-15 DIAGNOSIS — N179 Acute kidney failure, unspecified: Secondary | ICD-10-CM | POA: Diagnosis present

## 2021-08-15 DIAGNOSIS — D509 Iron deficiency anemia, unspecified: Secondary | ICD-10-CM | POA: Diagnosis present

## 2021-08-15 DIAGNOSIS — J449 Chronic obstructive pulmonary disease, unspecified: Secondary | ICD-10-CM | POA: Diagnosis present

## 2021-08-15 DIAGNOSIS — F17210 Nicotine dependence, cigarettes, uncomplicated: Secondary | ICD-10-CM | POA: Diagnosis present

## 2021-08-15 DIAGNOSIS — J189 Pneumonia, unspecified organism: Secondary | ICD-10-CM | POA: Diagnosis present

## 2021-08-15 DIAGNOSIS — Z716 Tobacco abuse counseling: Secondary | ICD-10-CM

## 2021-08-15 DIAGNOSIS — K219 Gastro-esophageal reflux disease without esophagitis: Secondary | ICD-10-CM | POA: Diagnosis present

## 2021-08-15 DIAGNOSIS — R0602 Shortness of breath: Secondary | ICD-10-CM

## 2021-08-15 DIAGNOSIS — Z79899 Other long term (current) drug therapy: Secondary | ICD-10-CM

## 2021-08-15 LAB — COMPREHENSIVE METABOLIC PANEL
ALT: 50 U/L — ABNORMAL HIGH (ref 0–44)
AST: 50 U/L — ABNORMAL HIGH (ref 15–41)
Albumin: 3.5 g/dL (ref 3.5–5.0)
Alkaline Phosphatase: 195 U/L — ABNORMAL HIGH (ref 38–126)
Anion gap: 12 (ref 5–15)
BUN: 36 mg/dL — ABNORMAL HIGH (ref 8–23)
CO2: 22 mmol/L (ref 22–32)
Calcium: 9.3 mg/dL (ref 8.9–10.3)
Chloride: 100 mmol/L (ref 98–111)
Creatinine, Ser: 1.46 mg/dL — ABNORMAL HIGH (ref 0.61–1.24)
GFR, Estimated: 53 mL/min — ABNORMAL LOW (ref >60)
Glucose, Bld: 97 mg/dL (ref 70–99)
Potassium: 4.3 mmol/L (ref 3.5–5.1)
Sodium: 134 mmol/L — ABNORMAL LOW (ref 135–145)
Total Bilirubin: 0.2 mg/dL — ABNORMAL LOW (ref 0.3–1.2)
Total Protein: 8.1 g/dL (ref 6.5–8.1)

## 2021-08-15 LAB — D-DIMER, QUANTITATIVE: D-Dimer, Quant: 1.56 ug/mL-FEU — ABNORMAL HIGH (ref 0.00–0.50)

## 2021-08-15 LAB — RETICULOCYTES
Immature Retic Fract: 17 % — ABNORMAL HIGH (ref 2.3–15.9)
RBC.: 4.58 MIL/uL (ref 4.22–5.81)
Retic Count, Absolute: 45.8 10*3/uL (ref 19.0–186.0)
Retic Ct Pct: 1 % (ref 0.4–3.1)

## 2021-08-15 LAB — CBC WITH DIFFERENTIAL/PLATELET
Abs Immature Granulocytes: 0.05 10*3/uL (ref 0.00–0.07)
Basophils Absolute: 0.1 10*3/uL (ref 0.0–0.1)
Basophils Relative: 1 %
Eosinophils Absolute: 0.2 10*3/uL (ref 0.0–0.5)
Eosinophils Relative: 2 %
HCT: 32.1 % — ABNORMAL LOW (ref 39.0–52.0)
Hemoglobin: 9.5 g/dL — ABNORMAL LOW (ref 13.0–17.0)
Immature Granulocytes: 1 %
Lymphocytes Relative: 16 %
Lymphs Abs: 1.7 10*3/uL (ref 0.7–4.0)
MCH: 20.2 pg — ABNORMAL LOW (ref 26.0–34.0)
MCHC: 29.6 g/dL — ABNORMAL LOW (ref 30.0–36.0)
MCV: 68.3 fL — ABNORMAL LOW (ref 80.0–100.0)
Monocytes Absolute: 0.9 10*3/uL (ref 0.1–1.0)
Monocytes Relative: 9 %
Neutro Abs: 7.3 10*3/uL (ref 1.7–7.7)
Neutrophils Relative %: 71 %
Platelets: 700 10*3/uL — ABNORMAL HIGH (ref 150–400)
RBC: 4.7 MIL/uL (ref 4.22–5.81)
RDW: 18.6 % — ABNORMAL HIGH (ref 11.5–15.5)
WBC: 10.2 10*3/uL (ref 4.0–10.5)
nRBC: 0 % (ref 0.0–0.2)

## 2021-08-15 LAB — FOLATE: Folate: 10 ng/mL (ref >5.9)

## 2021-08-15 LAB — CK: Total CK: 45 U/L — ABNORMAL LOW (ref 49–397)

## 2021-08-15 LAB — TROPONIN I (HIGH SENSITIVITY)
Troponin I (High Sensitivity): 5 ng/L (ref <18)
Troponin I (High Sensitivity): 4 ng/L (ref <18)

## 2021-08-15 LAB — IRON AND TIBC
Iron: 18 ug/dL — ABNORMAL LOW (ref 45–182)
Saturation Ratios: 5 % — ABNORMAL LOW (ref 17.9–39.5)
TIBC: 352 ug/dL (ref 250–450)
UIBC: 334 ug/dL

## 2021-08-15 LAB — RESP PANEL BY RT-PCR (FLU A&B, COVID) ARPGX2
Influenza A by PCR: NEGATIVE
Influenza B by PCR: NEGATIVE
SARS Coronavirus 2 by RT PCR: POSITIVE — AB

## 2021-08-15 LAB — PROCALCITONIN: Procalcitonin: 0.5 ng/mL

## 2021-08-15 LAB — C-REACTIVE PROTEIN: CRP: 8.5 mg/dL — ABNORMAL HIGH (ref <1.0)

## 2021-08-15 LAB — LACTIC ACID, PLASMA: Lactic Acid, Venous: 1 mmol/L (ref 0.5–1.9)

## 2021-08-15 LAB — VITAMIN B12: Vitamin B-12: 279 pg/mL (ref 180–914)

## 2021-08-15 LAB — FERRITIN: Ferritin: 61 ng/mL (ref 24–336)

## 2021-08-15 MED ORDER — IPRATROPIUM-ALBUTEROL 20-100 MCG/ACT IN AERS
1.0000 | INHALATION_SPRAY | Freq: Four times a day (QID) | RESPIRATORY_TRACT | Status: DC
  Filled 2021-08-15: qty 4

## 2021-08-15 MED ORDER — SODIUM CHLORIDE 0.9 % IV SOLN
100.0000 mg | Freq: Every day | INTRAVENOUS | Status: AC
  Administered 2021-08-16 – 2021-08-17 (×2): 100 mg via INTRAVENOUS
  Filled 2021-08-15 (×2): qty 20

## 2021-08-15 MED ORDER — IPRATROPIUM-ALBUTEROL 20-100 MCG/ACT IN AERS
1.0000 | INHALATION_SPRAY | Freq: Two times a day (BID) | RESPIRATORY_TRACT | Status: DC
  Administered 2021-08-15 – 2021-08-17 (×3): 1 via RESPIRATORY_TRACT

## 2021-08-15 MED ORDER — ENOXAPARIN SODIUM 40 MG/0.4ML IJ SOSY
40.0000 mg | PREFILLED_SYRINGE | INTRAMUSCULAR | Status: DC
  Administered 2021-08-15: 21:00:00 40 mg via SUBCUTANEOUS
  Filled 2021-08-15: qty 0.4

## 2021-08-15 MED ORDER — GUAIFENESIN-DM 100-10 MG/5ML PO SYRP: 10.0000 mL | ORAL_SOLUTION | ORAL | Status: DC | PRN

## 2021-08-15 MED ORDER — IPRATROPIUM-ALBUTEROL 20-100 MCG/ACT IN AERS: 1.0000 | INHALATION_SPRAY | Freq: Four times a day (QID) | RESPIRATORY_TRACT | Status: DC | PRN

## 2021-08-15 MED ORDER — ONDANSETRON HCL 4 MG PO TABS: 4.0000 mg | ORAL_TABLET | Freq: Four times a day (QID) | ORAL | Status: DC | PRN

## 2021-08-15 MED ORDER — SODIUM CHLORIDE 0.9 % IV SOLN
200.0000 mg | Freq: Once | INTRAVENOUS | Status: AC
  Administered 2021-08-15: 200 mg via INTRAVENOUS
  Filled 2021-08-15: qty 40

## 2021-08-15 MED ORDER — DEXAMETHASONE 4 MG PO TABS
6.0000 mg | ORAL_TABLET | Freq: Once | ORAL | Status: AC
  Administered 2021-08-15: 15:00:00 6 mg via ORAL
  Filled 2021-08-15: qty 2

## 2021-08-15 MED ORDER — ACETAMINOPHEN 325 MG PO TABS: 650.0000 mg | ORAL_TABLET | Freq: Four times a day (QID) | ORAL | Status: DC | PRN

## 2021-08-15 MED ORDER — ONDANSETRON HCL 4 MG/2ML IJ SOLN: 4.0000 mg | Freq: Four times a day (QID) | INTRAMUSCULAR | Status: DC | PRN

## 2021-08-15 MED ORDER — SENNOSIDES-DOCUSATE SODIUM 8.6-50 MG PO TABS: 1.0000 | ORAL_TABLET | Freq: Every evening | ORAL | Status: DC | PRN

## 2021-08-15 MED ORDER — SODIUM CHLORIDE 0.9 % IV SOLN: INTRAVENOUS | Status: AC

## 2021-08-15 NOTE — ED Provider Notes (Signed)
[facility] EMERGENCY DEPT Provider Note   CSN: [id number] Arrival date & time: 08/15/21  1212     History  Chief Complaint  Patient presents with   Shortness of Breath    Peter Wright. is a 66 y.o. male.  The history is provided by the patient, medical records and the spouse. No language interpreter was used.  Shortness of Breath Severity:  Moderate Onset quality:  Gradual Duration:  1 week Timing:  Constant Progression:  Worsening Chronicity:  New Context: URI   Relieved by:  Nothing Worsened by:  Exertion and coughing Ineffective treatments:  Inhaler (antiboitics) Associated symptoms: cough   Associated symptoms: no abdominal pain, no chest pain, no fever, no headaches, no hemoptysis, no neck pain, no rash, no sputum production, no syncope, no vomiting and no wheezing       Home Medications Prior to Admission medications   Medication Sig Start Date End Date Taking? Authorizing Provider  acetaminophen (TYLENOL) 325 MG tablet Take 650-975 mg by mouth every 6 (six) hours as needed (for pain/headache.).    [provider]  HYDROcodone-acetaminophen (NORCO/VICODIN) 5-325 MG tablet Take 1 tablet by mouth every 4 (four) hours as needed. 05/05/18   Evalee Jefferson, PA-C  ibuprofen (ADVIL,MOTRIN) 200 MG tablet Take 400-600 mg by mouth every 6 (six) hours as needed (for pain/headache.).    [provider]  oxyCODONE (ROXICODONE) 5 MG immediate release tablet Take 1 tablet (5 mg total) by mouth every 4 (four) hours as needed for severe pain. 11/18/16   Clovis Riley, MD  predniSONE (DELTASONE) 10 MG tablet Take 6 tablets day one, 5 tablets day two, 4 tablets day three, 3 tablets day four, 2 tablets day five, then 1 tablet day six 05/06/18   Evalee Jefferson, PA-C      Allergies    No known allergies    Review of Systems   Review of Systems  Constitutional:  Positive for chills and fatigue. Negative for fever.  HENT:  Negative for congestion.    Eyes:  Negative for visual disturbance.  Respiratory:  Positive for cough, chest tightness and shortness of breath. Negative for hemoptysis, sputum production, wheezing and stridor.   Cardiovascular:  Negative for chest pain, palpitations, leg swelling and syncope.  Gastrointestinal:  Negative for abdominal pain, constipation, diarrhea, nausea and vomiting.  Genitourinary:  Negative for dysuria.  Musculoskeletal:  Negative for back pain, neck pain and neck stiffness.  Skin:  Negative for rash and wound.  Neurological:  Negative for light-headedness, numbness and headaches.  Psychiatric/Behavioral:  Negative for agitation.   All other systems reviewed and are negative.  Physical Exam Updated Vital Signs BP 136/73    Pulse 78    Temp 97.9 F (36.6 C) (Oral)    Resp 16    Ht 5\' 8"  (1.727 m)    Wt 72.1 kg    SpO2 97%    BMI 24.18 kg/m  Physical Exam Vitals and nursing note reviewed.  Constitutional:      General: He is not in acute distress.    Appearance: He is well-developed. He is not ill-appearing, toxic-appearing or diaphoretic.  HENT:     Head: Normocephalic and atraumatic.     Mouth/Throat:     Pharynx: No pharyngeal swelling.  Eyes:     Conjunctiva/sclera: Conjunctivae normal.  Cardiovascular:     Rate and Rhythm: Normal rate and regular rhythm.     Heart sounds: No murmur heard. Pulmonary:     Effort:  Pulmonary effort is normal. Tachypnea present. No respiratory distress.     Breath sounds: Rhonchi present. No decreased breath sounds, wheezing or rales.  Chest:     Chest wall: Tenderness present.  Abdominal:     Palpations: Abdomen is soft.     Tenderness: There is no abdominal tenderness.  Musculoskeletal:        General: No swelling.     Cervical back: Neck supple.     Right lower leg: No edema.     Left lower leg: No edema.  Skin:    General: Skin is warm and dry.     Capillary Refill: Capillary refill takes less than 2 seconds.  Neurological:     General: No  focal deficit present.     Mental Status: He is alert.  Psychiatric:        Mood and Affect: Mood normal.    ED Results / Procedures / Treatments   Labs (all labs ordered are listed, but only abnormal results are displayed) Labs Reviewed  RESP PANEL BY RT-PCR (FLU A&B, COVID) ARPGX2 - Abnormal; Notable for the following components:      Result Value   SARS Coronavirus 2 by RT PCR POSITIVE (*)    All other components within normal limits  COMPREHENSIVE METABOLIC PANEL - Abnormal; Notable for the following components:   Sodium 134 (*)    BUN 36 (*)    Creatinine, Ser 1.46 (*)    AST 50 (*)    ALT 50 (*)    Alkaline Phosphatase 195 (*)    Total Bilirubin 0.2 (*)    GFR, Estimated 53 (*)    All other components within normal limits  CBC WITH DIFFERENTIAL/PLATELET - Abnormal; Notable for the following components:   Hemoglobin 9.5 (*)    HCT 32.1 (*)    MCV 68.3 (*)    MCH 20.2 (*)    MCHC 29.6 (*)    RDW 18.6 (*)    Platelets 700 (*)    All other components within normal limits  LACTIC ACID, PLASMA  URINALYSIS, ROUTINE W REFLEX MICROSCOPIC  D-DIMER, QUANTITATIVE  C-REACTIVE PROTEIN  TROPONIN I (HIGH SENSITIVITY)    EKG EKG Interpretation  Date/Time:  Wednesday August 15 2021 12:26:07 EST Ventricular Rate:  82 PR Interval:  146 QRS Duration: 92 QT Interval:  373 QTC Calculation: 436 R Axis:   71 Text Interpretation: Sinus rhythm Atrial premature complex When compared to prior,  similar appearance. No STEMI Confirmed by Antony Blackbird [phone]) on 08/15/2021 1:06:58 PM  Radiology DG Chest 2 View  Result Date: 08/15/2021 CLINICAL DATA:  Cough and shortness of breath. EXAM: CHEST - 2 VIEW COMPARISON:  Two-view chest x-ray 11/15/2016 FINDINGS: Heart size is normal. Changes of COPD noted with hyperinflation. Degenerative changes are noted in the thoracic spine. Right middle and upper lobe airspace opacities are present. The left lung is clear. IMPRESSION: 1. Right middle  and upper lobe airspace disease compatible with pneumonia. 2. Changes of COPD. 3. Degenerative changes in the thoracic spine. Electronically Signed   By: San Morelle M.D.   On: 08/15/2021 12:56    Procedures Procedures    Medications Ordered in ED Medications  dexamethasone (DECADRON) tablet 6 mg (has no administration in time range)    ED Course/ Medical Decision Making/ A&P                           Medical Decision Making Amount and/or Complexity  of Data Reviewed Labs: ordered. Radiology: ordered.  Risk Prescription drug management. Decision regarding hospitalization.    Saied Boldt Arlando Phares. is a 66 y.o. male with a past medical history significant for tobacco abuse, GERD, gout, and arthritis who presents with shortness of breath, fatigue, cough, and malaise despite being on multiple antibiotics for recent pneumonia.  According to patient, for last week and a half he has been having cough, fatigue, and shortness of breath.  He reports that he saw his PCP last week and was found to have pneumonia.  He was COVID and flu negative and was started on azithromycin and cefdinir but despite finishing azithromycin and still being on cefdinir, his symptoms are continue to worsen.  He is more drained and fatigued and can barely ambulate.  He reports he is extremely short of breath whenever he tries to get around which is not normal for him.  He is denying any chest pain at this time nausea, or vomiting.  Denies constipation, diarrhea, or urinary changes.  He is having some chills but no fever today.  He was told by his PCP to come in for further work-up and possible admission.  On exam, patient has coarse breath sounds bilaterally.  Chest was slightly tender to palpation but no murmur.  Abdomen nontender.  Patient moving all extremities.  No lower extremity tenderness or edema appreciated.  Vital signs reassuring initially.  Clinically I am concerned the patient may have worsening  pneumonia despite being on and completing several different antibiotics.  Will get screening labs, COVID/flu test, and repeat chest x-ray.  Anticipate shared decision-making conversation as I am concerned patient is failing outpatient antibiotics for pneumonia and may need admission for IV antibiotics.  2:55 PM Patient was found to be COVID-positive despite his reported negative test recently.  His oxygen saturations remained in the low 90s and we tried to ambulate him he could not even hardly stand or take any steps without getting winded and could not tolerate ambulation.  His x-ray did show evidence of pneumonia however I suspect is more COVID-pneumonia.  Lactic acid was normal and initial troponin is in process.  Labs show mild AKI.  Due to his increased work of breathing and inability to ambulate with overall worsening condition, do not feel he is safe for discharge home.  Will call for admission.          Final Clinical Impression(s) / ED Diagnoses Final diagnoses:  COVID-19  Shortness of breath  AKI (acute kidney injury) ([place])    Clinical Impression: 1. COVID-19   2. Shortness of breath   3. AKI (acute kidney injury) ([place])     Disposition: Admit  This note was prepared with assistance of Dragon voice recognition software. Occasional wrong-word or sound-a-like substitutions may have occurred due to the inherent limitations of voice recognition software.      Verlyn Dannenberg, Gwenyth Allegra, MD 08/15/21 [phone]

## 2021-08-15 NOTE — ED Notes (Signed)
Handoff report to [facility] 

## 2021-08-15 NOTE — ED Triage Notes (Signed)
Patient reports to the ER for [facility]. States he was diagnosed with pneumonia and unofficially told he has COPD by a provider in [place] but does not have a pulmonologist or PCP. Patient reports he has been feeling worse and has had no energy. States he has been on antibiotics since last Thursday and has continued to feel worse and weaker.

## 2021-08-15 NOTE — Treatment Plan (Signed)
66 yo with hx tobacco abuse, GERD, gout, arthritis who presented with SOB.  He's failed outpatient treatment for pneumonia.  Completed course of azithromycin and most of Dravon Nott course of cefdinir outpatient.  He presented today with progressive SOB, unable to ambulate due to fatigue.  Labs notable for mild hyponatremia, AKI (without recent baseline creatinine), mildly elevated LFT's.  Elevated platelets, anemia (without recent baseline).  Covid positive.  CXR with right middle and upper lobe airspace disease compatible with pneumonia.  Vitals notable for satting less than 94% at times, but most notably, unable to ambulate with assistance due to unsteadiness.  Vitals otherwise unremarkable.  Requested steroids for covid with sat <94.  Requested procal, d dimer, CRP.  Will hold abx given afebrile with normal white count.  Follow pending procal and inflammatory markers to help with next steps.  Admit to med surg obs at [facility].

## 2021-08-15 NOTE — ED Notes (Signed)
Attempt to walk patient to check pulse ox.  Patient unable to ambulate.  Stand with assistance pulse ox 92%  Very unsteady on feet.

## 2021-08-15 NOTE — ED Notes (Signed)
Handoff report given to Rosey Bath RN on 3E at [facility]

## 2021-08-15 NOTE — ED Notes (Signed)
[facility] at bedside

## 2021-08-15 NOTE — H&P (Signed)
History and Physical    [place]. [id number] DOB: 1956-01-20 DOA: 08/15/2021  PCP: Patient, No Pcp Per (Inactive)   Patient coming from: Home   Chief Complaint: Weakness, cough, SOB   HPI: Peter Cecchi. is a pleasant 66 y.o. male with medical history significant for arthritis, gout, and tobacco abuse, now presenting to the emergency department for evaluation of shortness of breath, cough, fatigue, and general weakness.  The patient reports that almost 2 weeks ago he developed a cough and shortness of breath, states that he tested negative for COVID-19 and influenza but was diagnosed with pneumonia and started on antibiotics.  He finished his azithromycin and is almost completed cefdinir but continues to have a cough and dyspnea, and has been progressively weak in general and fatigue.  Cough is nonproductive.  He denies focal numbness or weakness.  He also has a poor appetite but no vomiting or diarrhea.  He denies abdominal pain.  He denies chest pain or leg swelling.  He denies melena or hematochezia.  [facility] ED Course: Upon arrival to the ED, patient is found to be afebrile, saturating mid to upper 90s on room air, and with systolic blood pressure 683M.  EKG features sinus rhythm and chest x-ray with right middle and upper lobe airspace disease as well as COPD changes.  Chemistry panel notable for creatinine 1.46, alkaline phosphatase 125, AST 50, and ALT 50.  CBC notable for hemoglobin of 9.5, MCV 68.3, platelets 700,000.  COVID-19 PCR was positive.  Patient was given 6 mg oral Decadron and transferred to [facility] for admission.  Review of Systems:  All other systems reviewed and apart from HPI, are negative.  Past Medical History:  Diagnosis Date   Arthritis    GERD (gastroesophageal reflux disease)    takes otc meds   Gout    occasionally hits the feet and has 'moved up to my knees'    Past Surgical History:  Procedure Laterality Date   CYST  EXCISION     from scrotum     37 yrs ago   [place] Bilateral 11/18/2016   Procedure: LAPAROSCOPIC BILATERAL INGUINAL HERNIA REPAIR (TAP);  Surgeon: Clovis Riley, MD;  Location: [facility] ORS;  Service: General;  Laterality: Bilateral;   teeth removal     he thinks 10 yrs ago    Social History:   reports that he has been smoking cigarettes. He has a 11.25 pack-year smoking history. He has been exposed to tobacco smoke. He has never used smokeless tobacco. He reports that he does not drink alcohol and does not use drugs.  Allergies  Allergen Reactions   No Known Allergies     Family History  Problem Relation Age of Onset   Breast cancer Mother    Lung cancer Father    Ovarian cancer Sister      Prior to Admission medications   Medication Sig Start Date End Date Taking? Authorizing Provider  acetaminophen (TYLENOL) 325 MG tablet Take 650-975 mg by mouth every 6 (six) hours as needed (for pain/headache.).    [provider]  HYDROcodone-acetaminophen (NORCO/VICODIN) 5-325 MG tablet Take 1 tablet by mouth every 4 (four) hours as needed. 05/05/18   Evalee Jefferson, PA-C  ibuprofen (ADVIL,MOTRIN) 200 MG tablet Take 400-600 mg by mouth every 6 (six) hours as needed (for pain/headache.).    [provider]  oxyCODONE (ROXICODONE) 5 MG immediate release tablet Take 1 tablet (5 mg total) by mouth every  4 (four) hours as needed for severe pain. 11/18/16   Clovis Riley, MD  predniSONE (DELTASONE) 10 MG tablet Take 6 tablets day one, 5 tablets day two, 4 tablets day three, 3 tablets day four, 2 tablets day five, then 1 tablet day six 05/06/18   Evalee Jefferson, PA-C    Physical Exam: Vitals:   08/15/21 1345 08/15/21 1445 08/15/21 1800 08/15/21 1927  BP: 97/85 138/83 117/74 118/76  Pulse: 83 78 92 81  Resp: '19 15 15 16  ' Temp:    98.3 F (36.8 C)  TempSrc:    Oral  SpO2: 99% 96% 97% 98%  Weight:      Height:        Constitutional: NAD, calm  Eyes: PERTLA, lids  and conjunctivae normal ENMT: Mucous membranes are moist. Posterior pharynx clear of any exudate or lesions.   Neck: supple, no masses  Respiratory: no wheezing, no crackles. No accessory muscle use.  Cardiovascular: S1 & S2 heard, regular rate and rhythm. No extremity edema.   Abdomen: No distension, no tenderness, soft. Bowel sounds active.  Musculoskeletal: no clubbing / cyanosis. No joint deformity upper and lower extremities.   Skin: no significant rashes, lesions, ulcers. Warm, dry, well-perfused. Neurologic: CN 2-12 grossly intact. Moving all extremities. Alert and oriented.  Psychiatric: Pleasant. Cooperative.    Labs and Imaging on Admission: I have personally reviewed following labs and imaging studies  CBC: Recent Labs  Lab 08/15/21 1255  WBC 10.2  NEUTROABS 7.3  HGB 9.5*  HCT 32.1*  MCV 68.3*  PLT 449*   Basic Metabolic Panel: Recent Labs  Lab 08/15/21 1255  NA 134*  K 4.3  CL 100  CO2 22  GLUCOSE 97  BUN 36*  CREATININE 1.46*  CALCIUM 9.3   GFR: Estimated Creatinine Clearance: 48.8 mL/min (A) (by C-G formula based on SCr of 1.46 mg/dL (H)). Liver Function Tests: Recent Labs  Lab 08/15/21 1255  AST 50*  ALT 50*  ALKPHOS 195*  BILITOT 0.2*  PROT 8.1  ALBUMIN 3.5   No results for input(s): LIPASE, AMYLASE in the last 168 hours. No results for input(s): AMMONIA in the last 168 hours. Coagulation Profile: No results for input(s): INR, PROTIME in the last 168 hours. Cardiac Enzymes: No results for input(s): CKTOTAL, CKMB, CKMBINDEX, TROPONINI in the last 168 hours. BNP (last 3 results) No results for input(s): PROBNP in the last 8760 hours. HbA1C: No results for input(s): HGBA1C in the last 72 hours. CBG: No results for input(s): GLUCAP in the last 168 hours. Lipid Profile: No results for input(s): CHOL, HDL, LDLCALC, TRIG, CHOLHDL, LDLDIRECT in the last 72 hours. Thyroid Function Tests: No results for input(s): TSH, T4TOTAL, FREET4, T3FREE,  THYROIDAB in the last 72 hours. Anemia Panel: No results for input(s): VITAMINB12, FOLATE, FERRITIN, TIBC, IRON, RETICCTPCT in the last 72 hours. Urine analysis: No results found for: COLORURINE, APPEARANCEUR, LABSPEC, PHURINE, GLUCOSEU, HGBUR, BILIRUBINUR, KETONESUR, PROTEINUR, UROBILINOGEN, NITRITE, LEUKOCYTESUR Sepsis Labs: '@LABRCNTIP' (procalcitonin:4,lacticidven:4) ) Recent Results (from the past 240 hour(s))  Resp Panel by RT-PCR (Flu A&B, Covid) Nasopharyngeal Swab     Status: Abnormal   Collection Time: 08/15/21 12:55 PM   Specimen: Nasopharyngeal Swab; Nasopharyngeal(NP) swabs in vial transport medium  Result Value Ref Range Status   SARS Coronavirus 2 by RT PCR POSITIVE (A) NEGATIVE Final    Comment: (NOTE) SARS-CoV-2 target nucleic acids are DETECTED.  The SARS-CoV-2 RNA is generally detectable in upper respiratory specimens during the acute phase of infection. Positive results  are indicative of the presence of the identified virus, but do not rule out bacterial infection or co-infection with other pathogens not detected by the test. Clinical correlation with patient history and other diagnostic information is necessary to determine patient infection status. The expected result is Negative.  Fact Sheet for Patients: EntrepreneurPulse.com.au  Fact Sheet for Healthcare Providers: IncredibleEmployment.be  This test is not yet approved or cleared by the [place] FDA and  has been authorized for detection and/or diagnosis of SARS-CoV-2 by FDA under an Emergency Use Authorization (EUA).  This EUA will remain in effect (meaning this test can be used) for the duration of  the COVID-19 declaration under Section 564(b)(1) of the A ct, 21 U.S.C. section 360bbb-3(b)(1), unless the authorization is terminated or revoked sooner.     Influenza A by PCR NEGATIVE NEGATIVE Final   Influenza B by PCR NEGATIVE NEGATIVE Final    Comment:  (NOTE) The Xpert Xpress SARS-CoV-2/FLU/RSV plus assay is intended as an aid in the diagnosis of influenza from Nasopharyngeal swab specimens and should not be used as a sole basis for treatment. Nasal washings and aspirates are unacceptable for Xpert Xpress SARS-CoV-2/FLU/RSV testing.  Fact Sheet for Patients: EntrepreneurPulse.com.au  Fact Sheet for Healthcare Providers: IncredibleEmployment.be  This test is not yet approved or cleared by the [place] FDA and has been authorized for detection and/or diagnosis of SARS-CoV-2 by FDA under an Emergency Use Authorization (EUA). This EUA will remain in effect (meaning this test can be used) for the duration of the COVID-19 declaration under Section 564(b)(1) of the Act, 21 U.S.C. section 360bbb-3(b)(1), unless the authorization is terminated or revoked.  Performed at [facility], [address], [place], [place] [address]      Radiological Exams on Admission: DG Chest 2 View  Result Date: 08/15/2021 CLINICAL DATA:  Cough and shortness of breath. EXAM: CHEST - 2 VIEW COMPARISON:  Two-view chest x-ray 11/15/2016 FINDINGS: Heart size is normal. Changes of COPD noted with hyperinflation. Degenerative changes are noted in the thoracic spine. Right middle and upper lobe airspace opacities are present. The left lung is clear. IMPRESSION: 1. Right middle and upper lobe airspace disease compatible with pneumonia. 2. Changes of COPD. 3. Degenerative changes in the thoracic spine. Electronically Signed   By: San Morelle M.D.   On: 08/15/2021 12:56    EKG: Independently reviewed. Sinus rhythm, PAC.   Assessment/Plan   1. COVID-19 infection  - Presents with progressive general weakness and persistent SOB and cough despite recent outpatient tx for bacterial pneumonia and found to have COVID-19  - O2 saturations mid-upper 90s on rm air  - Start remdesivir, continue isolation and  supportive care, trend markers    2. COPD  - No wheezing   - Inhalers as needed     3. Renal insufficiency  - SCr is 1.46 on admission, up from 1.10 in 2018  - Appears hypovolemic on admission in setting of recent poor appetite  - Start IVF hydration, renally-dose medications, repeat chem panel in am    4. General weakness  - Presents with progressive general weakness with difficulty ambulating  - No focal deficit  - Likely related to #1, check serum CK, continue supportive care, consult PT    5. Anemia; thrombocytosis  - Hgb 9.5 on admission with MCV 68.3 and platelets 700k  - CBC was normal in 2018  - No melena or hematochezia  - Likely d/t IDA, check anemia panel  - He reports has  never been  screened for colon cancer, outpatient follow-up was recommended  6. Elevated LFTs  - Mild elevation in alk phos and transaminases noted on admission  - Benign abdominal exam  - Check CK, trend   7. Elevated d-dimer  - No chest pain or evidence for DVT  - Trend     DVT prophylaxis: Lovenox  Code Status: Full  Level of Care: Level of care: Med-Surg Family Communication: none present  Disposition Plan:  Patient is from: home  Anticipated d/c is to: TBD Anticipated d/c date is: 1/26 or 08/17/21  Patient currently: pending repeat labs, PT eval  Consults called: none  Admission status: Observation     Vianne Bulls, MD [facility]  08/15/2021, 8:38 PM

## 2021-08-16 LAB — COMPREHENSIVE METABOLIC PANEL
ALT: 48 U/L — ABNORMAL HIGH (ref 0–44)
AST: 42 U/L — ABNORMAL HIGH (ref 15–41)
Albumin: 2.9 g/dL — ABNORMAL LOW (ref 3.5–5.0)
Alkaline Phosphatase: 162 U/L — ABNORMAL HIGH (ref 38–126)
Anion gap: 9 (ref 5–15)
BUN: 36 mg/dL — ABNORMAL HIGH (ref 8–23)
CO2: 20 mmol/L — ABNORMAL LOW (ref 22–32)
Calcium: 8.9 mg/dL (ref 8.9–10.3)
Chloride: 106 mmol/L (ref 98–111)
Creatinine, Ser: 1.23 mg/dL (ref 0.61–1.24)
GFR, Estimated: >60 mL/min (ref >60)
Glucose, Bld: 128 mg/dL — ABNORMAL HIGH (ref 70–99)
Potassium: 4.8 mmol/L (ref 3.5–5.1)
Sodium: 135 mmol/L (ref 135–145)
Total Bilirubin: 0.3 mg/dL (ref 0.3–1.2)
Total Protein: 7.8 g/dL (ref 6.5–8.1)

## 2021-08-16 LAB — CBC WITH DIFFERENTIAL/PLATELET
Abs Immature Granulocytes: 0.06 10*3/uL (ref 0.00–0.07)
Basophils Absolute: 0 10*3/uL (ref 0.0–0.1)
Basophils Relative: 0 %
Eosinophils Absolute: 0 10*3/uL (ref 0.0–0.5)
Eosinophils Relative: 0 %
HCT: 30.5 % — ABNORMAL LOW (ref 39.0–52.0)
Hemoglobin: 9.3 g/dL — ABNORMAL LOW (ref 13.0–17.0)
Immature Granulocytes: 1 %
Lymphocytes Relative: 12 %
Lymphs Abs: 1 10*3/uL (ref 0.7–4.0)
MCH: 20.9 pg — ABNORMAL LOW (ref 26.0–34.0)
MCHC: 30.5 g/dL (ref 30.0–36.0)
MCV: 68.5 fL — ABNORMAL LOW (ref 80.0–100.0)
Monocytes Absolute: 0.4 10*3/uL (ref 0.1–1.0)
Monocytes Relative: 5 %
Neutro Abs: 7 10*3/uL (ref 1.7–7.7)
Neutrophils Relative %: 82 %
Platelets: 666 10*3/uL — ABNORMAL HIGH (ref 150–400)
RBC: 4.45 MIL/uL (ref 4.22–5.81)
RDW: 18.3 % — ABNORMAL HIGH (ref 11.5–15.5)
WBC: 8.4 10*3/uL (ref 4.0–10.5)
nRBC: 0 % (ref 0.0–0.2)

## 2021-08-16 LAB — PHOSPHORUS: Phosphorus: 3.2 mg/dL (ref 2.5–4.6)

## 2021-08-16 LAB — MAGNESIUM: Magnesium: 2.3 mg/dL (ref 1.7–2.4)

## 2021-08-16 LAB — D-DIMER, QUANTITATIVE: D-Dimer, Quant: 0.98 ug/mL-FEU — ABNORMAL HIGH (ref 0.00–0.50)

## 2021-08-16 LAB — PROCALCITONIN: Procalcitonin: 0.31 ng/mL

## 2021-08-16 LAB — HIV ANTIBODY (ROUTINE TESTING W REFLEX): HIV Screen 4th Generation wRfx: NONREACTIVE

## 2021-08-16 LAB — C-REACTIVE PROTEIN: CRP: 8.3 mg/dL — ABNORMAL HIGH (ref <1.0)

## 2021-08-16 MED ORDER — SODIUM CHLORIDE 0.9 % IV SOLN: INTRAVENOUS | Status: DC

## 2021-08-16 MED ORDER — NICOTINE 14 MG/24HR TD PT24
14.0000 mg | MEDICATED_PATCH | Freq: Every day | TRANSDERMAL | Status: DC
  Filled 2021-08-16: qty 1

## 2021-08-16 NOTE — Progress Notes (Signed)
PROGRESS NOTE    Holliday.  [id number] DOB: October 10, 1955 DOA: 08/15/2021 PCP: Patient, No Pcp Per (Inactive)   Brief Narrative: 66 year old male with a history of tobacco abuse, arthritis, gout admitted with generalized weakness cough and shortness of breath.  He was found to be COVID positive influenza negative and was admitted for further management. He completed a course of cefdinir and azithromycin as an outpatient for pneumonia without significant improvement.  He continued to be progressively more and more short of breath unable to ambulate due to fatigue. In the ER saturation was low 90s on room air. chest x-ray showed right middle and upper lobe airspace disease, creatinine on admission was 1.46 with a mildly elevated LFTs and mild hyponatremia. Procalcitonin low.  Assessment & Plan:   Principal Problem:   COVID-19 virus infection Active Problems:   COPD (chronic obstructive pulmonary disease) (HCC)   Renal insufficiency   Microcytic anemia   General weakness   Elevated LFTs   Positive D dimer   Thrombocytosis  #1 COVID-19 not hypoxic on admission.  He complains of generalized weakness nonproductive cough and increasing shortness of breath.   He received a dose of Decadron in the ED.  This was not continued as he was not hypoxic. Patient was started on remdesivir Finish a course of 3 days of remdesivir  Trend Inflammatory markers  Incentive spirometer to bedside and encourage  to use Out of bed, ambulate PT consult D-dimer 0.98 from 1.56 CRP 8.3   #2 history of COPD not in exacerbation-continue albuterol inhaler  #3 AKI secondary to poor p.o. intake.   Renal functions improving with IV fluids. Creatinine is 1.23 from 1.46 on admission. Continue IV fluids.  #4 tobacco abuse counseled to quit use of tobacco.  Will start nicotine patch while in hospital  #5 generalized weakness likely secondary to COVID and decreased appetite and poor p.o. intake. PT  eval pending  #6 iron deficiency anemia/anemia of chronic disease follow-up with GI as an outpatient for colonoscopy.  He has never had a GI work-up.  He denies any hematochezia or melena.   Estimated body mass index is 24.18 kg/m as calculated from the following:   Height as of this encounter: 5\' 8"  (1.727 m).   Weight as of this encounter: 72.1 kg.  DVT prophylaxis: Lovenox  code Status: Full code Family Communication: None at bedside Disposition Plan:  Status is: Inpatient The patient will require care spanning > 2 midnights and should be moved to inpatient because: Generalized weakness shortness of breath cough COVID dehydration AKI on IV fluids and remdesivir Consultants:  None  Procedures: None Antimicrobials: None Subjective: Continues to feel weak has not walked since he came PT eval is pending Complains of shortness of breath and nonproductive cough Slow to respond Was living with his sister and sister brought him to the ER prior to that he lived alone  Objective: Vitals:   08/15/21 1800 08/15/21 1927 08/16/21 0145 08/16/21 0356  BP: 117/74 118/76 (!) 140/91 133/90  Pulse: 92 81 88 81  Resp: 15 16 16 16   Temp:  98.3 F (36.8 C) (!) 97.5 F (36.4 C) (!) 97.4 F (36.3 C)  TempSrc:  Oral Oral Oral  SpO2: 97% 98% 97% 97%  Weight:      Height:        Intake/Output Summary (Last 24 hours) at 08/16/2021 0845 Last data filed at 08/16/2021 0600 Gross per 24 hour  Intake 1210 ml  Output 700 ml  Net 510 ml   Filed Weights   08/15/21 1227  Weight: 72.1 kg    Examination:  General exam: Appears mild distress due to shortness of breath  respiratory system: Rhonchi bilaterally to auscultation. Respiratory effort normal. Cardiovascular system: S1 & S2 heard, RRR. No JVD, murmurs, rubs, gallops or clicks. No pedal edema. Gastrointestinal system: Abdomen is nondistended, soft and nontender. No organomegaly or masses felt. Normal bowel sounds heard. Central nervous  system: Alert and oriented. No focal neurological deficits. Extremities: Symmetric 5 x 5 power. Skin: No rashes, lesions or ulcers Psychiatry: Judgement and insight appear normal. Mood & affect appropriate.     Data Reviewed: I have personally reviewed following labs and imaging studies  CBC: Recent Labs  Lab 08/15/21 1255 08/16/21 0407  WBC 10.2 8.4  NEUTROABS 7.3 7.0  HGB 9.5* 9.3*  HCT 32.1* 30.5*  MCV 68.3* 68.5*  PLT 700* 666*   Basic Metabolic Panel: Recent Labs  Lab 08/15/21 1255 08/16/21 0407  NA 134* 135  K 4.3 4.8  CL 100 106  CO2 22 20*  GLUCOSE 97 128*  BUN 36* 36*  CREATININE 1.46* 1.23  CALCIUM 9.3 8.9  MG  --  2.3  PHOS  --  3.2   GFR: Estimated Creatinine Clearance: 57.9 mL/min (by C-G formula based on SCr of 1.23 mg/dL). Liver Function Tests: Recent Labs  Lab 08/15/21 1255 08/16/21 0407  AST 50* 42*  ALT 50* 48*  ALKPHOS 195* 162*  BILITOT 0.2* 0.3  PROT 8.1 7.8  ALBUMIN 3.5 2.9*   No results for input(s): LIPASE, AMYLASE in the last 168 hours. No results for input(s): AMMONIA in the last 168 hours. Coagulation Profile: No results for input(s): INR, PROTIME in the last 168 hours. Cardiac Enzymes: Recent Labs  Lab 08/15/21 2035  CKTOTAL 45*   BNP (last 3 results) No results for input(s): PROBNP in the last 8760 hours. HbA1C: No results for input(s): HGBA1C in the last 72 hours. CBG: No results for input(s): GLUCAP in the last 168 hours. Lipid Profile: No results for input(s): CHOL, HDL, LDLCALC, TRIG, CHOLHDL, LDLDIRECT in the last 72 hours. Thyroid Function Tests: No results for input(s): TSH, T4TOTAL, FREET4, T3FREE, THYROIDAB in the last 72 hours. Anemia Panel: Recent Labs    08/15/21 2035  VITAMINB12 279  FOLATE 10.0  FERRITIN 61  TIBC 352  IRON 18*  RETICCTPCT 1.0   Sepsis Labs: Recent Labs  Lab 08/15/21 1255 08/15/21 1524 08/16/21 0407  PROCALCITON  --  0.50 0.31  LATICACIDVEN 1.0  --   --     Recent  Results (from the past 240 hour(s))  Resp Panel by RT-PCR (Flu A&B, Covid) Nasopharyngeal Swab     Status: Abnormal   Collection Time: 08/15/21 12:55 PM   Specimen: Nasopharyngeal Swab; Nasopharyngeal(NP) swabs in vial transport medium  Result Value Ref Range Status   SARS Coronavirus 2 by RT PCR POSITIVE (A) NEGATIVE Final    Comment: (NOTE) SARS-CoV-2 target nucleic acids are DETECTED.  The SARS-CoV-2 RNA is generally detectable in upper respiratory specimens during the acute phase of infection. Positive results are indicative of the presence of the identified virus, but do not rule out bacterial infection or co-infection with other pathogens not detected by the test. Clinical correlation with patient history and other diagnostic information is necessary to determine patient infection status. The expected result is Negative.  Fact Sheet for Patients: BloggerCourse.com  Fact Sheet for Healthcare Providers: SeriousBroker.it  This test is not yet  approved or cleared by the [place] and  has been authorized for detection and/or diagnosis of SARS-CoV-2 by FDA under an Emergency Use Authorization (EUA).  This EUA will remain in effect (meaning this test can be used) for the duration of  the COVID-19 declaration under Section 564(b)(1) of the A ct, 21 U.S.C. section 360bbb-3(b)(1), unless the authorization is terminated or revoked sooner.     Influenza A by PCR NEGATIVE NEGATIVE Final   Influenza B by PCR NEGATIVE NEGATIVE Final    Comment: (NOTE) The Xpert Xpress SARS-CoV-2/FLU/RSV plus assay is intended as an aid in the diagnosis of influenza from Nasopharyngeal swab specimens and should not be used as a sole basis for treatment. Nasal washings and aspirates are unacceptable for Xpert Xpress SARS-CoV-2/FLU/RSV testing.  Fact Sheet for Patients: EntrepreneurPulse.com.au  Fact Sheet for Healthcare  Providers: IncredibleEmployment.be  This test is not yet approved or cleared by the [place] FDA and has been authorized for detection and/or diagnosis of SARS-CoV-2 by FDA under an Emergency Use Authorization (EUA). This EUA will remain in effect (meaning this test can be used) for the duration of the COVID-19 declaration under Section 564(b)(1) of the Act, 21 U.S.C. section 360bbb-3(b)(1), unless the authorization is terminated or revoked.  Performed at [facility], [address], [place], [place] [address]          Radiology Studies: DG Chest 2 View  Result Date: 08/15/2021 CLINICAL DATA:  Cough and shortness of breath. EXAM: CHEST - 2 VIEW COMPARISON:  Two-view chest x-ray 11/15/2016 FINDINGS: Heart size is normal. Changes of COPD noted with hyperinflation. Degenerative changes are noted in the thoracic spine. Right middle and upper lobe airspace opacities are present. The left lung is clear. IMPRESSION: 1. Right middle and upper lobe airspace disease compatible with pneumonia. 2. Changes of COPD. 3. Degenerative changes in the thoracic spine. Electronically Signed   By: San Morelle M.D.   On: 08/15/2021 12:56        Scheduled Meds:  enoxaparin (LOVENOX) injection  40 mg Subcutaneous Q24H   Ipratropium-Albuterol  1 puff Inhalation BID   Continuous Infusions:  remdesivir 100 mg in NS 100 mL       LOS: 0 days    Georgette Shell, MD  08/16/2021, 8:45 AM

## 2021-08-16 NOTE — Progress Notes (Signed)
°  Transition of Care [facility]) Screening Note   Patient Details  Name: Peter Wright. Date of Birth: 03/11/1956   Transition of Care [facility]) CM/SW Contact:    Amada Jupiter, LCSW Phone Number: 08/16/2021, 11:42 AM    Transition of Care Department [facility]) has reviewed patient and no TOC needs have been identified at this time. We will continue to monitor patient advancement through interdisciplinary progression rounds. If new patient transition needs arise, please place a TOC consult.

## 2021-08-16 NOTE — Evaluation (Signed)
Physical Therapy Evaluation Patient Details Name: Peter Wright. MRN: [id number] DOB: 1956-01-31 Today's Date: 08/16/2021  History of Present Illness  66 year old male with a history of tobacco abuse, arthritis, gout admitted with generalized weakness cough and shortness of breath.  He was found to be COVID positive influenza negative and was admitted for further management.  Clinical Impression  Pt admitted with above diagnosis. Pt ambulated 160' with RW, no loss of balance, SaO2 97% on room air walking, 3/4 dyspnea, HR 80s. From a PT standpoint, he is ready to DC home.  Pt currently with functional limitations due to the deficits listed below (see PT Problem List). Pt will benefit from skilled PT to increase their independence and safety with mobility to allow discharge to the venue listed below.          Recommendations for follow up therapy are one component of a multi-disciplinary discharge planning process, led by the attending physician.  Recommendations may be updated based on patient status, additional functional criteria and insurance authorization.  Follow Up Recommendations No PT follow up    Assistance Recommended at Discharge Set up Supervision/Assistance  Patient can return home with the following  Assist for transportation;Assistance with cooking/housework    Equipment Recommendations None recommended by PT  Recommendations for Other Services       Functional Status Assessment Patient has had a recent decline in their functional status and demonstrates the ability to make significant improvements in function in a reasonable and predictable amount of time.     Precautions / Restrictions Precautions Precautions: None Precaution Comments: denies h/o falls in past 6 months Restrictions Weight Bearing Restrictions: No      Mobility  Bed Mobility Overal bed mobility: Independent                  Transfers Overall transfer level: Independent                       Ambulation/Gait Ambulation/Gait assistance: Supervision Gait Distance (Feet): 160 Feet Assistive device: Rolling walker (2 wheels) Gait Pattern/deviations: Step-through pattern, Decreased stride length Gait velocity: decr     General Gait Details: VCs for positioning in RW as he tends to walk too far behind it, SaO2 97% on room air while walking, 3/4 dyspnea (pt reports some dyspnea is baseline), HR 80s walking  Stairs            Wheelchair Mobility    Modified Rankin (Stroke Patients Only)       Balance Overall balance assessment: Modified Independent                                           Pertinent Vitals/Pain Pain Assessment Pain Assessment: No/denies pain    Home Living Family/patient expects to be discharged to:: Private residence Living Arrangements: Alone Available Help at Discharge: Family;Available PRN/intermittently Type of Home: House Home Access: Stairs to enter   Entrance Stairs-Number of Steps: 1   Home Layout: One level Home Equipment: Agricultural consultant (2 wheels);Cane - single point Additional Comments: can borrow rollator from friend    Prior Function Prior Level of Function : Independent/Modified Independent             Mobility Comments: lives alone, was staying with sister for a few days PTA due to not feeling well, walks with SPC at baseline but had been  borrowing a rollator from a friend for several days PTA. ADLs Comments: independent     Hand Dominance        Extremity/Trunk Assessment   Upper Extremity Assessment Upper Extremity Assessment: Overall WFL for tasks assessed    Lower Extremity Assessment Lower Extremity Assessment: Overall WFL for tasks assessed    Cervical / Trunk Assessment Cervical / Trunk Assessment: Normal  Communication   Communication: HOH  Cognition Arousal/Alertness: Awake/alert Behavior During Therapy: WFL for tasks assessed/performed Overall Cognitive  Status: Within Functional Limits for tasks assessed                                          General Comments      Exercises     Assessment/Plan    PT Assessment Patient needs continued PT services  PT Problem List Decreased activity tolerance;Decreased knowledge of use of DME;Decreased mobility       PT Treatment Interventions Gait training;Therapeutic exercise;Therapeutic activities;Functional mobility training;Patient/family education;DME instruction    PT Goals (Current goals can be found in the Care Plan section)  Acute Rehab PT Goals 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 PT Goal Formulation: With patient Time For Goal Achievement: 08/30/21 Potential to Achieve Goals: Good    Frequency Min 3X/week     Co-evaluation               AM-PAC PT "6 Clicks" Mobility  Outcome Measure Help needed turning from your back to your side while in a flat bed without using bedrails?: None Help needed moving from lying on your back to sitting on the side of a flat bed without using bedrails?: None Help needed moving to and from a bed to a chair (including a wheelchair)?: None Help needed standing up from a chair using your arms (e.g., wheelchair or bedside chair)?: None Help needed to walk in hospital room?: A Little Help needed climbing 3-5 steps with a railing? : A Little 6 Click Score: 22    End of Session Equipment Utilized During Treatment: Gait belt Activity Tolerance: Patient tolerated treatment well Patient left: in chair;with chair alarm set;with call bell/phone within reach Nurse Communication: Mobility status PT Visit Diagnosis: Difficulty in walking, not elsewhere classified (R26.2)    Time: 3570-1779 PT Time Calculation (min) (ACUTE ONLY): 36 min   Charges:   PT Evaluation $PT Eval Moderate Complexity: 1 Mod PT Treatments $Gait Training: 8-22 mins       Ralene Bathe Kistler PT 08/16/2021  Acute Rehabilitation  Services Pager [phone] Office [phone]

## 2021-08-17 LAB — COMPREHENSIVE METABOLIC PANEL
ALT: 37 U/L (ref 0–44)
AST: 28 U/L (ref 15–41)
Albumin: 2.5 g/dL — ABNORMAL LOW (ref 3.5–5.0)
Alkaline Phosphatase: 117 U/L (ref 38–126)
Anion gap: 8 (ref 5–15)
BUN: 32 mg/dL — ABNORMAL HIGH (ref 8–23)
CO2: 20 mmol/L — ABNORMAL LOW (ref 22–32)
Calcium: 8.2 mg/dL — ABNORMAL LOW (ref 8.9–10.3)
Chloride: 108 mmol/L (ref 98–111)
Creatinine, Ser: 1.12 mg/dL (ref 0.61–1.24)
GFR, Estimated: >60 mL/min (ref >60)
Glucose, Bld: 89 mg/dL (ref 70–99)
Potassium: 4.8 mmol/L (ref 3.5–5.1)
Sodium: 136 mmol/L (ref 135–145)
Total Bilirubin: 0.3 mg/dL (ref 0.3–1.2)
Total Protein: 6.7 g/dL (ref 6.5–8.1)

## 2021-08-17 LAB — CBC WITH DIFFERENTIAL/PLATELET
Abs Immature Granulocytes: 0.06 10*3/uL (ref 0.00–0.07)
Basophils Absolute: 0.1 10*3/uL (ref 0.0–0.1)
Basophils Relative: 1 %
Eosinophils Absolute: 0.1 10*3/uL (ref 0.0–0.5)
Eosinophils Relative: 1 %
HCT: 29 % — ABNORMAL LOW (ref 39.0–52.0)
Hemoglobin: 8.7 g/dL — ABNORMAL LOW (ref 13.0–17.0)
Immature Granulocytes: 1 %
Lymphocytes Relative: 27 %
Lymphs Abs: 2.6 10*3/uL (ref 0.7–4.0)
MCH: 20.9 pg — ABNORMAL LOW (ref 26.0–34.0)
MCHC: 30 g/dL (ref 30.0–36.0)
MCV: 69.5 fL — ABNORMAL LOW (ref 80.0–100.0)
Monocytes Absolute: 0.8 10*3/uL (ref 0.1–1.0)
Monocytes Relative: 8 %
Neutro Abs: 6 10*3/uL (ref 1.7–7.7)
Neutrophils Relative %: 62 %
Platelets: 635 10*3/uL — ABNORMAL HIGH (ref 150–400)
RBC: 4.17 MIL/uL — ABNORMAL LOW (ref 4.22–5.81)
RDW: 18.6 % — ABNORMAL HIGH (ref 11.5–15.5)
WBC: 9.7 10*3/uL (ref 4.0–10.5)
nRBC: 0 % (ref 0.0–0.2)

## 2021-08-17 LAB — C-REACTIVE PROTEIN: CRP: 4.6 mg/dL — ABNORMAL HIGH (ref <1.0)

## 2021-08-17 LAB — PROCALCITONIN: Procalcitonin: 0.19 ng/mL

## 2021-08-17 LAB — D-DIMER, QUANTITATIVE: D-Dimer, Quant: 1.04 ug/mL-FEU — ABNORMAL HIGH (ref 0.00–0.50)

## 2021-08-17 MED ORDER — NICOTINE 14 MG/24HR TD PT24: 14.0000 mg | MEDICATED_PATCH | Freq: Every day | TRANSDERMAL | 0 refills | Status: AC

## 2021-08-17 NOTE — Progress Notes (Signed)
Patient discharged after giving instrcutions and patient teach back.Patient left room by NT and nurse to front desk.Marius Ditch RN

## 2021-08-17 NOTE — Plan of Care (Signed)

## 2021-08-17 NOTE — Discharge Summary (Signed)
Physician Discharge Summary  Peter Wright. [id number] DOB: [birth year] DOA: 08/15/2021  PCP: Patient, No Pcp Per (Inactive)  Admit date: 08/15/2021 Discharge date: 08/17/2021  Admitted From: Home Disposition: Home  Recommendations for Outpatient Follow-up:  Follow up with PCP in 1-2 weeks Please obtain BMP/CBC in one week Please follow up at [facility]  Home Health: None Equipment/Devices: None Discharge Condition: Stable CODE STATUS: Full code Diet recommendation: Cardiac Brief/Interim Summary: 66 year old male with a history of tobacco abuse, arthritis, gout admitted with generalized weakness cough and shortness of breath.  He was found to be COVID positive influenza negative and was admitted for further management. He completed a course of cefdinir and azithromycin as an outpatient for pneumonia without significant improvement.  He continued to be progressively more and more short of breath unable to ambulate due to fatigue. In the ER saturation was low 90s on room air. chest x-ray showed right middle and upper lobe airspace disease, creatinine on admission was 1.46 with a mildly elevated LFTs and mild hyponatremia. Procalcitonin low.  Discharge Diagnoses:  Principal Problem:   COVID-19 virus infection Active Problems:   COPD (chronic obstructive pulmonary disease) (HCC)   Renal insufficiency   Microcytic anemia   General weakness   Elevated LFTs   Positive D dimer   Thrombocytosis     #1 COVID-19 not hypoxic on admission.  He complained of generalized weakness nonproductive cough and increasing shortness of breath.   He received a dose of Decadron in the ED.  This was not continued as he was not hypoxic. Patient was started on remdesivir Finished a course of 3 days of remdesivir Patient remained on room air throughout the hospital stay.  Patient was seen by physical therapy who did not recommend any outpatient PT.    #2 history of COPD not in  exacerbation-continue albuterol inhaler   #3 AKI resolved improved with IV fluids.     #4 tobacco abuse counseled to quit use of tobacco.  Was given nicotine patch in the hospital will discharge on the same.   #5 generalized weakness likely secondary to COVID and decreased appetite and poor p.o. intake.  Resolved.   #6 iron deficiency anemia/anemia of chronic disease follow-up with GI as an outpatient for colonoscopy.  He has never had a GI work-up.  He denies any hematochezia or melena.    Estimated body mass index is 24.18 kg/m as calculated from the following:   Height as of this encounter: 5\' 8"  (1.727 m).   Weight as of this encounter: 72.1 kg.  Discharge Instructions  Discharge Instructions     Diet - low sodium heart healthy   Complete by: As directed    Increase activity slowly   Complete by: As directed       Allergies as of 08/17/2021   No Known Allergies      Medication List     STOP taking these medications    azithromycin 250 MG tablet Commonly known as: ZITHROMAX   cefdinir 300 MG capsule Commonly known as: OMNICEF   ibuprofen 200 MG tablet Commonly known as: ADVIL       TAKE these medications    acetaminophen 325 MG tablet Commonly known as: TYLENOL Take 650-975 mg by mouth every 6 (six) hours as needed (for pain/headache.).   albuterol (2.5 MG/3ML) 0.083% nebulizer solution Commonly known as: PROVENTIL Inhale 3 mLs into the lungs every 4 (four) hours as needed for shortness of breath.   nicotine 14  mg/24hr patch Commonly known as: NICODERM CQ - dosed in mg/24 hours Place 1 patch (14 mg total) onto the skin daily. Start taking on: August 18, 2021   vitamin C 500 MG tablet Commonly known as: ASCORBIC ACID Take 500 mg by mouth daily.        Follow-up [place] Follow up.   Contact information: [address] [place] [phone]                No Known Allergies  Consultations: none   Procedures/Studies: DG Chest 2 View  Result Date: 08/15/2021 CLINICAL DATA:  Cough and shortness of breath. EXAM: CHEST - 2 VIEW COMPARISON:  Two-view chest x-ray 11/15/2016 FINDINGS: Heart size is normal. Changes of COPD noted with hyperinflation. Degenerative changes are noted in the thoracic spine. Right middle and upper lobe airspace opacities are present. The left lung is clear. IMPRESSION: 1. Right middle and upper lobe airspace disease compatible with pneumonia. 2. Changes of COPD. 3. Degenerative changes in the thoracic spine. Electronically Signed   By: San Morelle M.D.   On: 08/15/2021 12:56   (Echo, Carotid, EGD, Colonoscopy, ERCP)    Subjective:  Seen in bed awake and alert in no acute distress denies shortness of breath feels better Walked with therapy anxious to go home Presently he is living with his sister till he gets better Discharge Exam: Vitals:   08/17/21 0556 08/17/21 1304  BP: 125/76 132/78  Pulse: 65 68  Resp: 14 15  Temp: 98 F (36.7 C) 97.6 F (36.4 C)  SpO2: 96% 96%   Vitals:   08/16/21 1707 08/16/21 2135 08/17/21 0556 08/17/21 1304  BP:  122/77 125/76 132/78  Pulse:  80 65 68  Resp:  16 14 15   Temp:  98.9 F (37.2 C) 98 F (36.7 C) 97.6 F (36.4 C)  TempSrc:  Oral Oral Oral  SpO2: 97% 98% 96% 96%  Weight:      Height:        General: Pt is alert, awake, not in acute distress Cardiovascular: RRR, S1/S2 +, no rubs, no gallops Respiratory: Scattered rhonchi bilaterally, no wheezing, no rhonchi Abdominal: Soft, NT, ND, bowel sounds + Extremities: no edema, no cyanosis    The results of significant diagnostics from this hospitalization (including imaging, microbiology, ancillary and laboratory) are listed below for reference.     Microbiology: Recent Results (from the past 240 hour(s))  Resp Panel by RT-PCR (Flu A&B, Covid) Nasopharyngeal Swab     Status: Abnormal   Collection  Time: 08/15/21 12:55 PM   Specimen: Nasopharyngeal Swab; Nasopharyngeal(NP) swabs in vial transport medium  Result Value Ref Range Status   SARS Coronavirus 2 by RT PCR POSITIVE (A) NEGATIVE Final    Comment: (NOTE) SARS-CoV-2 target nucleic acids are DETECTED.  The SARS-CoV-2 RNA is generally detectable in upper respiratory specimens during the acute phase of infection. Positive results are indicative of the presence of the identified virus, but do not rule out bacterial infection or co-infection with other pathogens not detected by the test. Clinical correlation with patient history and other diagnostic information is necessary to determine patient infection status. The expected result is Negative.  Fact Sheet for Patients: EntrepreneurPulse.com.au  Fact Sheet for Healthcare Providers: IncredibleEmployment.be  This test is not yet approved or cleared by the [place] FDA and  has been authorized for detection and/or diagnosis of SARS-CoV-2 by FDA under an Emergency Use  Authorization (EUA).  This EUA will remain in effect (meaning this test can be used) for the duration of  the COVID-19 declaration under Section 564(b)(1) of the A ct, 21 U.S.C. section 360bbb-3(b)(1), unless the authorization is terminated or revoked sooner.     Influenza A by PCR NEGATIVE NEGATIVE Final   Influenza B by PCR NEGATIVE NEGATIVE Final    Comment: (NOTE) The Xpert Xpress SARS-CoV-2/FLU/RSV plus assay is intended as an aid in the diagnosis of influenza from Nasopharyngeal swab specimens and should not be used as a sole basis for treatment. Nasal washings and aspirates are unacceptable for Xpert Xpress SARS-CoV-2/FLU/RSV testing.  Fact Sheet for Patients: EntrepreneurPulse.com.au  Fact Sheet for Healthcare Providers: IncredibleEmployment.be  This test is not yet approved or cleared by the [place] FDA and has been  authorized for detection and/or diagnosis of SARS-CoV-2 by FDA under an Emergency Use Authorization (EUA). This EUA will remain in effect (meaning this test can be used) for the duration of the COVID-19 declaration under Section 564(b)(1) of the Act, 21 U.S.C. section 360bbb-3(b)(1), unless the authorization is terminated or revoked.  Performed at [facility], [address] [place], [place] [address]      Labs: BNP (last 3 results) No results for input(s): BNP in the last 8760 hours. Basic Metabolic Panel: Recent Labs  Lab 08/15/21 1255 08/16/21 0407 08/17/21 0431  NA 134* 135 136  K 4.3 4.8 4.8  CL 100 106 108  CO2 22 20* 20*  GLUCOSE 97 128* 89  BUN 36* 36* 32*  CREATININE 1.46* 1.23 1.12  CALCIUM 9.3 8.9 8.2*  MG  --  2.3  --   PHOS  --  3.2  --    Liver Function Tests: Recent Labs  Lab 08/15/21 1255 08/16/21 0407 08/17/21 0431  AST 50* 42* 28  ALT 50* 48* 37  ALKPHOS 195* 162* 117  BILITOT 0.2* 0.3 0.3  PROT 8.1 7.8 6.7  ALBUMIN 3.5 2.9* 2.5*   No results for input(s): LIPASE, AMYLASE in the last 168 hours. No results for input(s): AMMONIA in the last 168 hours. CBC: Recent Labs  Lab 08/15/21 1255 08/16/21 0407 08/17/21 0431  WBC 10.2 8.4 9.7  NEUTROABS 7.3 7.0 6.0  HGB 9.5* 9.3* 8.7*  HCT 32.1* 30.5* 29.0*  MCV 68.3* 68.5* 69.5*  PLT 700* 666* 635*   Cardiac Enzymes: Recent Labs  Lab 08/15/21 2035  CKTOTAL 45*   BNP: Invalid input(s): POCBNP CBG: No results for input(s): GLUCAP in the last 168 hours. D-Dimer Recent Labs    08/16/21 0407 08/17/21 0431  DDIMER 0.98* 1.04*   Hgb A1c No results for input(s): HGBA1C in the last 72 hours. Lipid Profile No results for input(s): CHOL, HDL, LDLCALC, TRIG, CHOLHDL, LDLDIRECT in the last 72 hours. Thyroid function studies No results for input(s): TSH, T4TOTAL, T3FREE, THYROIDAB in the last 72 hours.  Invalid input(s): FREET3 Anemia work up [employer]     08/15/21 2035  VITAMINB12 279  FOLATE 10.0  FERRITIN 61  TIBC 352  IRON 18*  RETICCTPCT 1.0   Urinalysis No results found for: COLORURINE, APPEARANCEUR, LABSPEC, [place], GLUCOSEU, HGBUR, [place], [place], PROTEINUR, UROBILINOGEN, NITRITE, LEUKOCYTESUR Sepsis Labs Invalid input(s): PROCALCITONIN,  WBC,  LACTICIDVEN Microbiology Recent Results (from the past 240 hour(s))  Resp Panel by RT-PCR (Flu A&B, Covid) Nasopharyngeal Swab     Status: Abnormal   Collection Time: 08/15/21 12:55 PM   Specimen: Nasopharyngeal Swab; Nasopharyngeal(NP) swabs in vial transport medium  Result  Value Ref Range Status   SARS Coronavirus 2 by RT PCR POSITIVE (A) NEGATIVE Final    Comment: (NOTE) SARS-CoV-2 target nucleic acids are DETECTED.  The SARS-CoV-2 RNA is generally detectable in upper respiratory specimens during the acute phase of infection. Positive results are indicative of the presence of the identified virus, but do not rule out bacterial infection or co-infection with other pathogens not detected by the test. Clinical correlation with patient history and other diagnostic information is necessary to determine patient infection status. The expected result is Negative.  Fact Sheet for Patients: EntrepreneurPulse.com.au  Fact Sheet for Healthcare Providers: IncredibleEmployment.be  This test is not yet approved or cleared by the [place] FDA and  has been authorized for detection and/or diagnosis of SARS-CoV-2 by FDA under an Emergency Use Authorization (EUA).  This EUA will remain in effect (meaning this test can be used) for the duration of  the COVID-19 declaration under Section 564(b)(1) of the A ct, 21 U.S.C. section 360bbb-3(b)(1), unless the authorization is terminated or revoked sooner.     Influenza A by PCR NEGATIVE NEGATIVE Final   Influenza B by PCR NEGATIVE NEGATIVE Final    Comment: (NOTE) The Xpert Xpress  SARS-CoV-2/FLU/RSV plus assay is intended as an aid in the diagnosis of influenza from Nasopharyngeal swab specimens and should not be used as a sole basis for treatment. Nasal washings and aspirates are unacceptable for Xpert Xpress SARS-CoV-2/FLU/RSV testing.  Fact Sheet for Patients: EntrepreneurPulse.com.au  Fact Sheet for Healthcare Providers: IncredibleEmployment.be  This test is not yet approved or cleared by the [place] FDA and has been authorized for detection and/or diagnosis of SARS-CoV-2 by FDA under an Emergency Use Authorization (EUA). This EUA will remain in effect (meaning this test can be used) for the duration of the COVID-19 declaration under Section 564(b)(1) of the Act, 21 U.S.C. section 360bbb-3(b)(1), unless the authorization is terminated or revoked.  Performed at [facility], [address] [place], [place] [address]      Time coordinating discharge: 38 minutes  SIGNED:   Georgette Shell, MD  [facility] 08/17/2021, 2:54 PM

## 2021-08-17 NOTE — Plan of Care (Signed)
°  Problem: Education: Goal: Knowledge of General Education information will improve Description: Including pain rating scale, medication(s)/side effects and non-pharmacologic comfort measures 08/17/2021 1712 by Sherian Maroon, RN Outcome: Adequate for Discharge 08/17/2021 1656 by Sherian Maroon, RN Outcome: Adequate for Discharge   Problem: Health Behavior/Discharge Planning: Goal: Ability to manage health-related needs will improve 08/17/2021 1712 by Sherian Maroon, RN Outcome: Adequate for Discharge 08/17/2021 1656 by Sherian Maroon, RN Outcome: Adequate for Discharge   Problem: Clinical Measurements: Goal: Ability to maintain clinical measurements within normal limits will improve 08/17/2021 1712 by Sherian Maroon, RN Outcome: Adequate for Discharge 08/17/2021 1656 by Sherian Maroon, RN Outcome: Adequate for Discharge Goal: Will remain free from infection 08/17/2021 1712 by Sherian Maroon, RN Outcome: Adequate for Discharge 08/17/2021 1656 by Sherian Maroon, RN Outcome: Adequate for Discharge Goal: Diagnostic test results will improve 08/17/2021 1712 by Sherian Maroon, RN Outcome: Adequate for Discharge 08/17/2021 1656 by Sherian Maroon, RN Outcome: Adequate for Discharge Goal: Respiratory complications will improve 08/17/2021 1712 by Sherian Maroon, RN Outcome: Adequate for Discharge 08/17/2021 1656 by Sherian Maroon, RN Outcome: Adequate for Discharge Goal: Cardiovascular complication will be avoided 08/17/2021 1712 by Sherian Maroon, RN Outcome: Adequate for Discharge 08/17/2021 1656 by Sherian Maroon, RN Outcome: Adequate for Discharge   Problem: Activity: Goal: Risk for activity intolerance will decrease 08/17/2021 1712 by Sherian Maroon, RN Outcome: Adequate for Discharge 08/17/2021 1656 by Sherian Maroon, RN Outcome: Adequate for Discharge   Problem: Nutrition: Goal: Adequate nutrition will be  maintained 08/17/2021 1712 by Sherian Maroon, RN Outcome: Adequate for Discharge 08/17/2021 1656 by Sherian Maroon, RN Outcome: Adequate for Discharge   Problem: Coping: Goal: Level of anxiety will decrease 08/17/2021 1712 by Sherian Maroon, RN Outcome: Adequate for Discharge 08/17/2021 1656 by Sherian Maroon, RN Outcome: Adequate for Discharge   Problem: Elimination: Goal: Will not experience complications related to bowel motility 08/17/2021 1712 by Sherian Maroon, RN Outcome: Adequate for Discharge 08/17/2021 1656 by Sherian Maroon, RN Outcome: Adequate for Discharge Goal: Will not experience complications related to urinary retention 08/17/2021 1712 by Sherian Maroon, RN Outcome: Adequate for Discharge 08/17/2021 1656 by Sherian Maroon, RN Outcome: Adequate for Discharge   Problem: Pain Managment: Goal: General experience of comfort will improve 08/17/2021 1712 by Sherian Maroon, RN Outcome: Adequate for Discharge 08/17/2021 1656 by Sherian Maroon, RN Outcome: Adequate for Discharge   Problem: Safety: Goal: Ability to remain free from injury will improve 08/17/2021 1712 by Sherian Maroon, RN Outcome: Adequate for Discharge 08/17/2021 1656 by Sherian Maroon, RN Outcome: Adequate for Discharge   Problem: Skin Integrity: Goal: Risk for impaired skin integrity will decrease 08/17/2021 1712 by Sherian Maroon, RN Outcome: Adequate for Discharge 08/17/2021 1656 by Sherian Maroon, RN Outcome: Adequate for Discharge   Problem: Acute Rehab PT Goals(only PT should resolve) Goal: Pt Will Ambulate Outcome: Adequate for Discharge Goal: Pt/caregiver will Perform Home Exercise Program Outcome: Adequate for Discharge

## 2021-08-17 NOTE — Plan of Care (Signed)
  Problem: Coping: Goal: Level of anxiety will decrease Outcome: Progressing   Problem: Pain Managment: Goal: General experience of comfort will improve Outcome: Progressing   

## 2022-06-24 IMAGING — DX DG CHEST 2V
2 series · 2 of 2 positions shown · non-contrast
Comparison: Two-view chest x-ray 11/15/2016

CLINICAL DATA: Cough and shortness of breath.

EXAM:
CHEST - 2 VIEW

[chest pa]
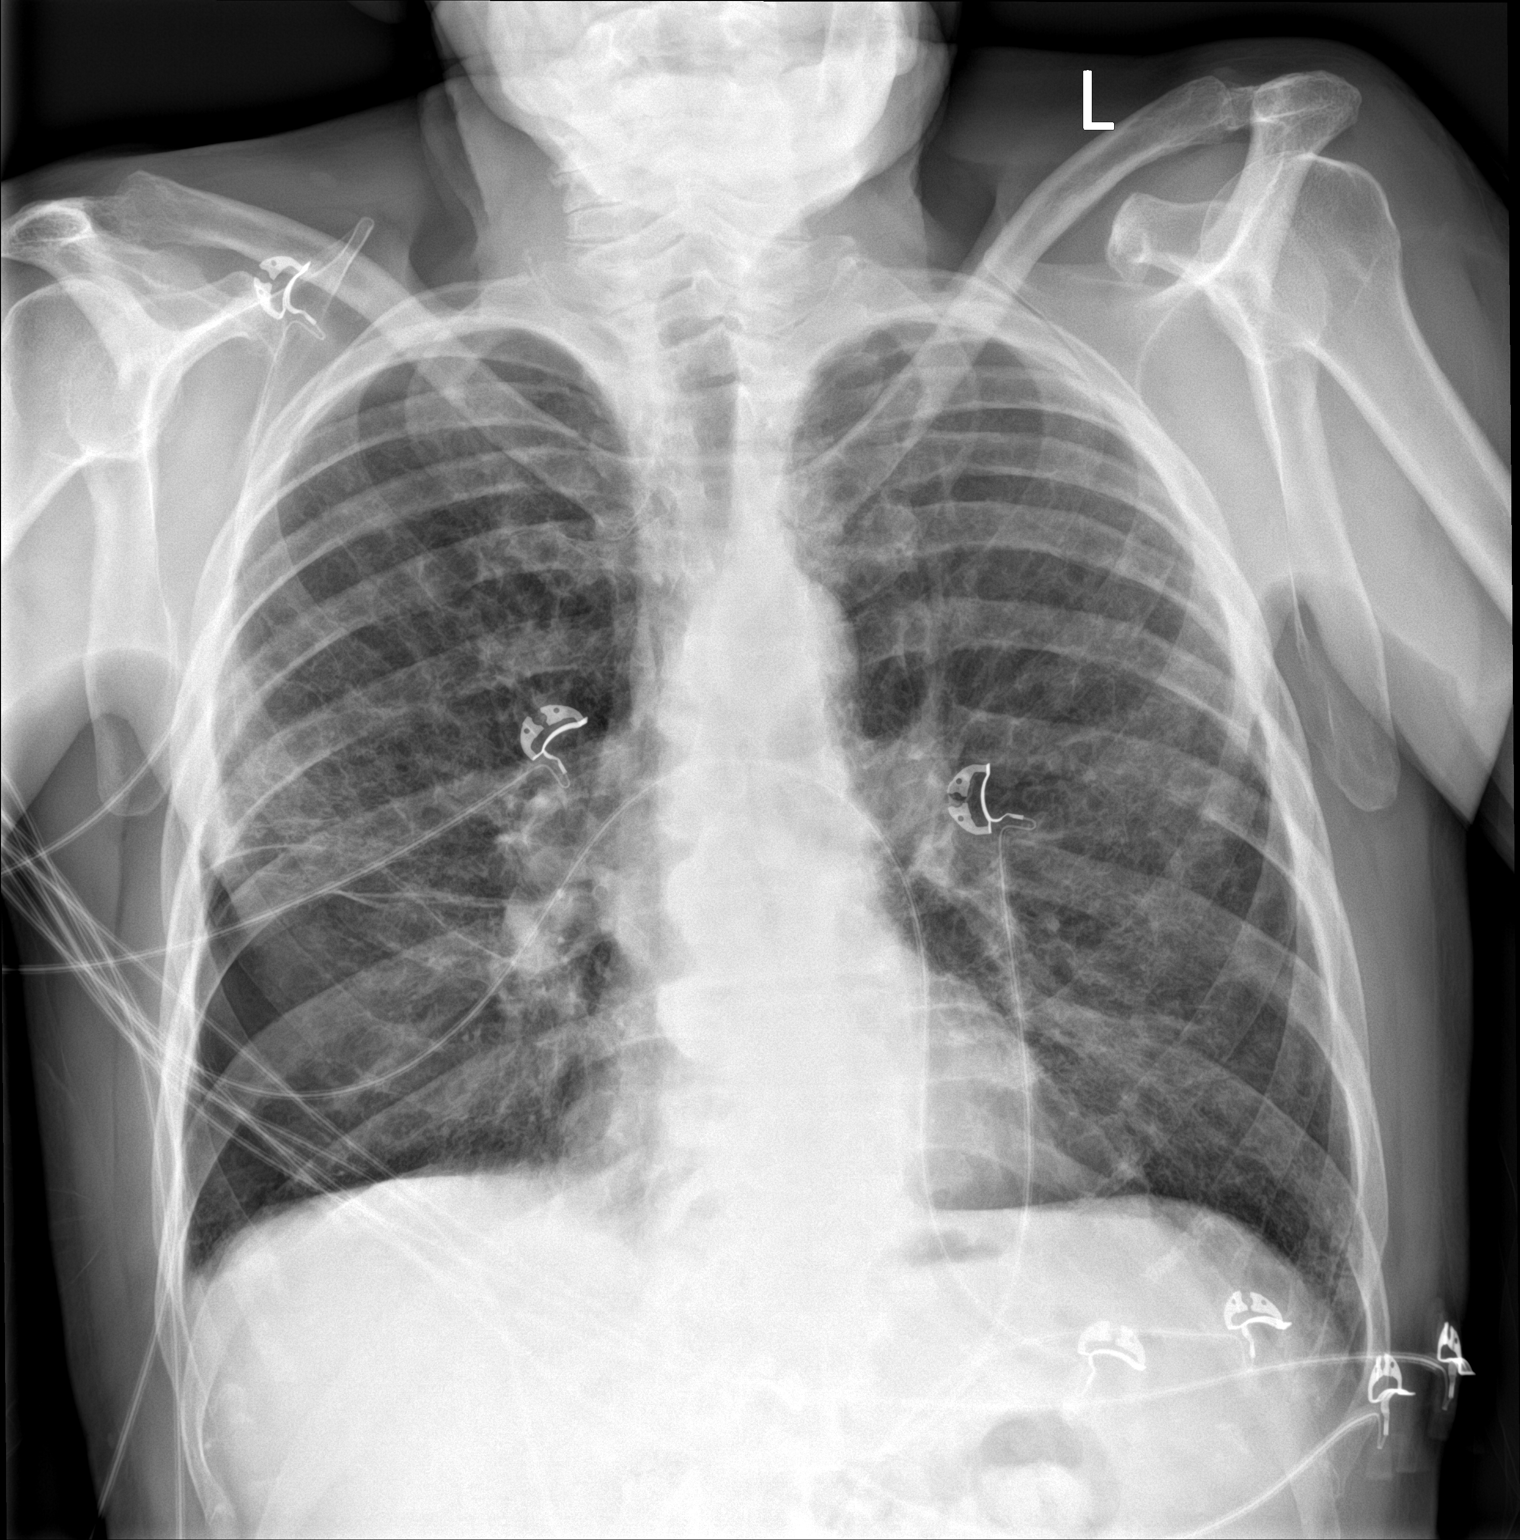

[chest lat]
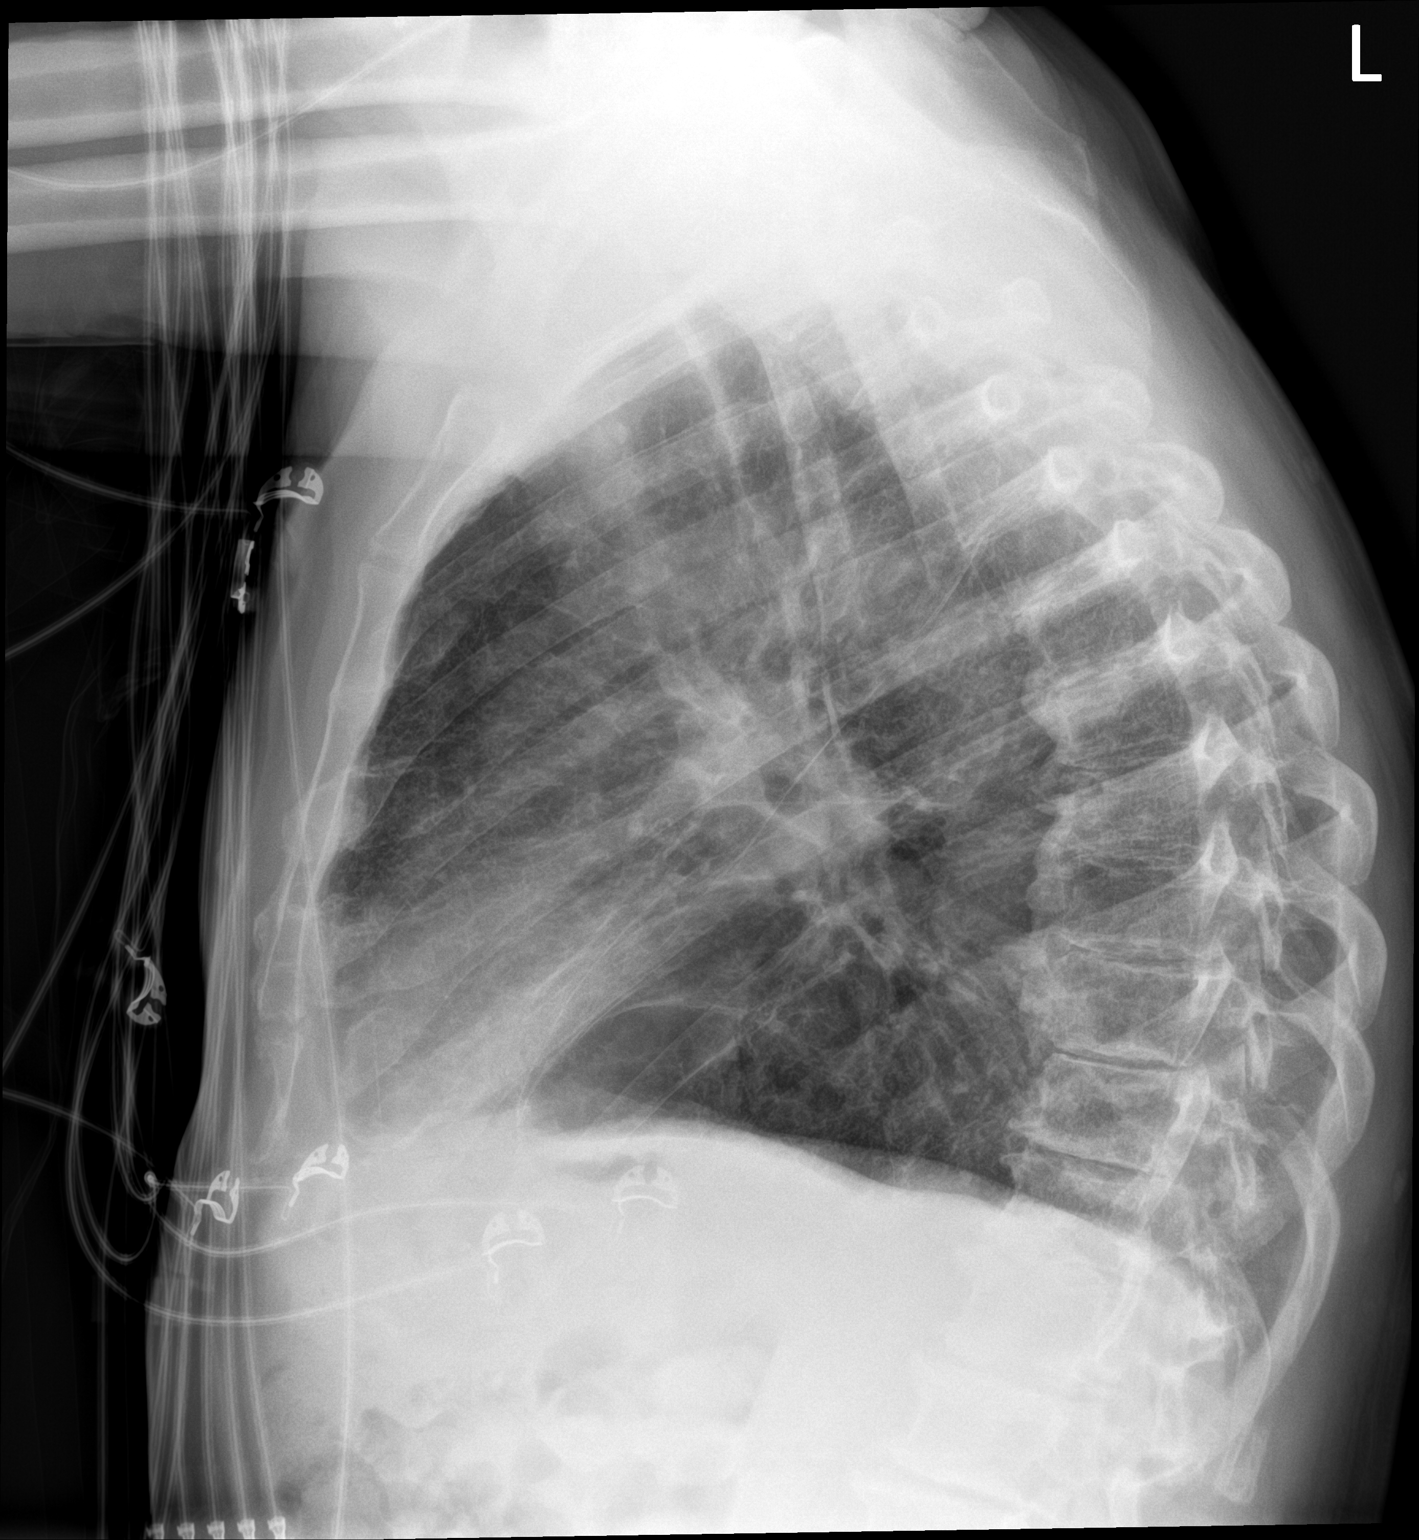

[2 of 2 positions shown; findings below may reference images not displayed]

FINDINGS: Heart size is normal. Changes of COPD noted with hyperinflation.
Degenerative changes are noted in the thoracic spine. Right middle
and upper lobe airspace opacities are present. The left lung is
clear.
IMPRESSION: 1. Right middle and upper lobe airspace disease compatible with
pneumonia.
2. Changes of COPD.
3. Degenerative changes in the thoracic spine.

## 2023-10-10 ENCOUNTER — Emergency Department (HOSPITAL_BASED_OUTPATIENT_CLINIC_OR_DEPARTMENT_OTHER)
Admission: EM | Admit: 2023-10-10 | Discharge: 2023-10-10 | Disposition: A | Discharge status: Home / Self Care | Payer: Self-pay | Attending: Emergency Medicine | Admitting: Emergency Medicine

## 2023-10-10 ENCOUNTER — Encounter (HOSPITAL_BASED_OUTPATIENT_CLINIC_OR_DEPARTMENT_OTHER): Payer: Self-pay | Admitting: Emergency Medicine

## 2023-10-10 ENCOUNTER — Other Ambulatory Visit: Payer: Self-pay

## 2023-10-10 ENCOUNTER — Emergency Department (HOSPITAL_BASED_OUTPATIENT_CLINIC_OR_DEPARTMENT_OTHER): Payer: Self-pay

## 2023-10-10 DIAGNOSIS — L309 Dermatitis, unspecified: Secondary | ICD-10-CM | POA: Insufficient documentation

## 2023-10-10 LAB — CBC WITH DIFFERENTIAL/PLATELET
Abs Immature Granulocytes: 0.03 10*3/uL (ref 0.00–0.07)
Basophils Absolute: 0 10*3/uL (ref 0.0–0.1)
Basophils Relative: 0 %
Eosinophils Absolute: 0.4 10*3/uL (ref 0.0–0.5)
Eosinophils Relative: 6 %
HCT: 36.8 % — ABNORMAL LOW (ref 39.0–52.0)
Hemoglobin: 12.8 g/dL — ABNORMAL LOW (ref 13.0–17.0)
Immature Granulocytes: 0 %
Lymphocytes Relative: 25 %
Lymphs Abs: 1.7 10*3/uL (ref 0.7–4.0)
MCH: 33 pg (ref 26.0–34.0)
MCHC: 34.8 g/dL (ref 30.0–36.0)
MCV: 94.8 fL (ref 80.0–100.0)
Monocytes Absolute: 0.5 10*3/uL (ref 0.1–1.0)
Monocytes Relative: 7 %
Neutro Abs: 4.3 10*3/uL (ref 1.7–7.7)
Neutrophils Relative %: 62 %
Platelets: 286 10*3/uL (ref 150–400)
RBC: 3.88 MIL/uL — ABNORMAL LOW (ref 4.22–5.81)
RDW: 17.7 % — ABNORMAL HIGH (ref 11.5–15.5)
WBC: 6.9 10*3/uL (ref 4.0–10.5)
nRBC: 0 % (ref 0.0–0.2)

## 2023-10-10 LAB — BASIC METABOLIC PANEL
Anion gap: 9 (ref 5–15)
BUN: 12 mg/dL (ref 8–23)
CO2: 22 mmol/L (ref 22–32)
Calcium: 8.6 mg/dL — ABNORMAL LOW (ref 8.9–10.3)
Chloride: 104 mmol/L (ref 98–111)
Creatinine, Ser: 1.03 mg/dL (ref 0.61–1.24)
GFR, Estimated: >60 mL/min (ref >60)
Glucose, Bld: 101 mg/dL — ABNORMAL HIGH (ref 70–99)
Potassium: 3.9 mmol/L (ref 3.5–5.1)
Sodium: 135 mmol/L (ref 135–145)

## 2023-10-10 MED ORDER — TRIAMCINOLONE ACETONIDE 0.1 % EX CREA: 1.0000 | TOPICAL_CREAM | Freq: Two times a day (BID) | CUTANEOUS | 0 refills | Status: AC

## 2023-10-10 NOTE — ED Provider Notes (Signed)
 [place] EMERGENCY DEPARTMENT AT [facility] Provider Note   CSN: [id number] Arrival date & time: 10/10/23  1204     History  Chief Complaint  Patient presents with   Hand Swelling    Peter Prows Kobyn Kray. is a 68 y.o. male.  Patient to ED with bilateral, generalized hand swelling and discoloration. Symptoms started 2 days ago. He reports history of similar symptoms years ago but none since. No specific joint pain. No fever, blistering or wounds. He reports itching more than discomfort.   The history is provided by the patient. No language interpreter was used.       Home Medications Prior to Admission medications   Medication Sig Start Date End Date Taking? Authorizing Provider  triamcinolone cream (KENALOG) 0.1 % Apply 1 Application topically 2 (two) times daily. 10/10/23  Yes Elpidio Anis, PA-C  acetaminophen (TYLENOL) 325 MG tablet Take 650-975 mg by mouth every 6 (six) hours as needed (for pain/headache.).    [provider]  albuterol (PROVENTIL) (2.5 MG/3ML) 0.083% nebulizer solution Inhale 3 mLs into the lungs every 4 (four) hours as needed for shortness of breath. 08/09/21   [provider]  nicotine (NICODERM CQ - DOSED IN MG/24 HOURS) 14 mg/24hr patch Place 1 patch (14 mg total) onto the skin daily. 08/18/21   Alwyn Ren, MD  vitamin C (ASCORBIC ACID) 500 MG tablet Take 500 mg by mouth daily.    [provider]      Allergies    Patient has no active allergies.    Review of Systems   Review of Systems  Physical Exam Updated Vital Signs BP (!) 130/90 (BP Location: Right Arm)   Pulse 68   Temp 98.1 F (36.7 C) (Oral)   Resp 16   Ht 5\' 8"  (1.727 m)   Wt 72.1 kg   SpO2 100%   BMI 24.17 kg/m  Physical Exam Constitutional:      Appearance: He is well-developed.  Pulmonary:     Effort: Pulmonary effort is normal.  Musculoskeletal:        General: Normal range of motion.     Cervical back: Normal range of  motion.     Comments: Hands swollen bilaterally encompassing the entire hand. There are patches of purpura to right palm and left lateral index finger. Nontender. No wounds or blisters.  Skin:    General: Skin is warm and dry.     Findings: Erythema present.     Comments: Dry scaling rash to lower back without discoloration c/w eczema.   Neurological:     Mental Status: He is alert and oriented to person, place, and time.     ED Results / Procedures / Treatments   Labs (all labs ordered are listed, but only abnormal results are displayed) Labs Reviewed  CBC WITH DIFFERENTIAL/PLATELET - Abnormal; Notable for the following components:      Result Value   RBC 3.88 (*)    Hemoglobin 12.8 (*)    HCT 36.8 (*)    RDW 17.7 (*)    All other components within normal limits  BASIC METABOLIC PANEL - Abnormal; Notable for the following components:   Glucose, Bld 101 (*)    Calcium 8.6 (*)    All other components within normal limits    EKG None  Radiology No results found.  Procedures Procedures    Medications Ordered in ED Medications - No data to display  ED Course/ Medical Decision Making/ A&P Clinical Course  as of 10/10/23 1522  Fri Oct 10, 2023  1517 Presents with bilateral hand swelling as detailed in HPI. Findings c/w eczematous condition. Areas of purpuritic change felt secondary to swelling and dyshydrotic form of eczema. Platelets normal, not an easy bleeder, history of same. Patient seen by Dr. Andria Meuse who agrees.   Shared decision making with the patient regarding a CXR to evaluate for chest mass. Patient declines citing no chest pain or other symptoms. Encouraged to return if he changes his mind about the test.   [SU]    Clinical Course User Index [SU] Elpidio Anis, PA-C                                 Medical Decision Making          Final Clinical Impression(s) / ED Diagnoses Final diagnoses:  Eczema of both hands    Rx / DC Orders ED  Discharge Orders          Ordered    triamcinolone cream (KENALOG) 0.1 %  2 times daily        10/10/23 1521              Elpidio Anis, PA-C 10/10/23 1522    Tegeler, Canary Brim, MD 10/10/23 1526

## 2023-10-10 NOTE — ED Triage Notes (Signed)
 Pt via pov from home with bilateral hand swelling x 2 days. Pt states he had a dry patch on his back the day before this occurred. Denies hx of the same. Pt alert & oriented, nad noted.

## 2023-10-10 NOTE — Discharge Instructions (Signed)
 Please use the steroid cream twice daily to reduce swelling and skin irritation of both hands. You may have to use this cream for an extended period to resolve symptoms - 2-3 weeks.   Follow up with your doctor if symptoms persist.

## 2024-07-22 DEATH — deceased
